# Patient Record
Sex: Female | Born: 1985 | Race: Black or African American | Hispanic: No | Marital: Single | Smoking: Never smoker
Health system: Southern US, Community
[De-identification: ages and names within clinical notes are randomized; demographics above are authoritative.]

## PROBLEM LIST (undated history)

## (undated) DIAGNOSIS — R638 Other symptoms and signs concerning food and fluid intake: Secondary | ICD-10-CM

## (undated) DIAGNOSIS — L83 Acanthosis nigricans: Secondary | ICD-10-CM

## (undated) DIAGNOSIS — N883 Incompetence of cervix uteri: Secondary | ICD-10-CM

## (undated) DIAGNOSIS — N946 Dysmenorrhea, unspecified: Secondary | ICD-10-CM

## (undated) DIAGNOSIS — R7303 Prediabetes: Secondary | ICD-10-CM

## (undated) DIAGNOSIS — G35 Multiple sclerosis: Secondary | ICD-10-CM

## (undated) DIAGNOSIS — J309 Allergic rhinitis, unspecified: Secondary | ICD-10-CM

## (undated) DIAGNOSIS — N941 Unspecified dyspareunia: Secondary | ICD-10-CM

## (undated) DIAGNOSIS — G43909 Migraine, unspecified, not intractable, without status migrainosus: Secondary | ICD-10-CM

## (undated) DIAGNOSIS — N879 Dysplasia of cervix uteri, unspecified: Secondary | ICD-10-CM

## (undated) DIAGNOSIS — G35D Multiple sclerosis, unspecified: Secondary | ICD-10-CM

## (undated) DIAGNOSIS — N939 Abnormal uterine and vaginal bleeding, unspecified: Secondary | ICD-10-CM

## (undated) HISTORY — DX: Unspecified dyspareunia: N94.10

## (undated) HISTORY — PX: CHOLECYSTECTOMY: SHX55

## (undated) HISTORY — PX: LAPAROSCOPY: SHX197

## (undated) HISTORY — PX: CERVICAL BIOPSY  W/ LOOP ELECTRODE EXCISION: SUR135

## (undated) HISTORY — DX: Allergic rhinitis, unspecified: J30.9

## (undated) HISTORY — DX: Dysplasia of cervix uteri, unspecified: N87.9

## (undated) HISTORY — DX: Prediabetes: R73.03

## (undated) HISTORY — PX: DILATION AND CURETTAGE OF UTERUS: SHX78

## (undated) HISTORY — DX: Other symptoms and signs concerning food and fluid intake: R63.8

## (undated) HISTORY — DX: Dysmenorrhea, unspecified: N94.6

## (undated) HISTORY — DX: Abnormal uterine and vaginal bleeding, unspecified: N93.9

## (undated) HISTORY — DX: Incompetence of cervix uteri: N88.3

## (undated) HISTORY — DX: Acanthosis nigricans: L83

## (undated) HISTORY — DX: Multiple sclerosis: G35

## (undated) HISTORY — PX: HYSTEROSCOPY WITH D & C: SHX1775

## (undated) HISTORY — PX: CERVICAL CERCLAGE: SHX1329

## (undated) HISTORY — PX: ABDOMINAL SURGERY: SHX537

## (undated) HISTORY — DX: Multiple sclerosis, unspecified: G35.D

---

## 2004-06-21 ENCOUNTER — Emergency Department: Payer: Self-pay | Admitting: Emergency Medicine

## 2005-02-26 ENCOUNTER — Emergency Department: Payer: Self-pay | Admitting: Unknown Physician Specialty

## 2005-02-28 ENCOUNTER — Ambulatory Visit: Payer: Self-pay | Admitting: Unknown Physician Specialty

## 2005-03-08 ENCOUNTER — Ambulatory Visit: Payer: Self-pay | Admitting: Vascular Surgery

## 2006-05-14 ENCOUNTER — Emergency Department: Payer: Self-pay | Admitting: Emergency Medicine

## 2006-06-03 ENCOUNTER — Emergency Department: Payer: Self-pay

## 2006-09-11 ENCOUNTER — Emergency Department: Payer: Self-pay | Admitting: Unknown Physician Specialty

## 2006-11-14 ENCOUNTER — Other Ambulatory Visit: Payer: Self-pay

## 2006-11-14 ENCOUNTER — Emergency Department: Payer: Self-pay | Admitting: Emergency Medicine

## 2007-01-13 ENCOUNTER — Encounter: Payer: Self-pay | Admitting: Maternal & Fetal Medicine

## 2007-02-13 ENCOUNTER — Encounter: Payer: Self-pay | Admitting: Maternal & Fetal Medicine

## 2007-03-03 ENCOUNTER — Encounter: Payer: Self-pay | Admitting: Maternal & Fetal Medicine

## 2007-03-20 ENCOUNTER — Encounter: Payer: Self-pay | Admitting: Maternal & Fetal Medicine

## 2007-03-31 ENCOUNTER — Inpatient Hospital Stay: Payer: Self-pay | Admitting: Unknown Physician Specialty

## 2008-04-14 ENCOUNTER — Emergency Department: Payer: Self-pay | Admitting: Emergency Medicine

## 2008-04-14 ENCOUNTER — Other Ambulatory Visit: Payer: Self-pay

## 2008-10-01 ENCOUNTER — Emergency Department: Payer: Self-pay | Admitting: Emergency Medicine

## 2008-10-22 ENCOUNTER — Emergency Department: Payer: Self-pay | Admitting: Internal Medicine

## 2008-12-02 ENCOUNTER — Emergency Department: Payer: Self-pay | Admitting: Emergency Medicine

## 2009-01-03 ENCOUNTER — Ambulatory Visit: Payer: Self-pay | Admitting: Obstetrics and Gynecology

## 2009-01-06 ENCOUNTER — Ambulatory Visit: Payer: Self-pay | Admitting: Obstetrics and Gynecology

## 2009-02-19 ENCOUNTER — Observation Stay: Payer: Self-pay | Admitting: Unknown Physician Specialty

## 2009-06-02 ENCOUNTER — Emergency Department: Payer: Self-pay | Admitting: Unknown Physician Specialty

## 2009-06-23 ENCOUNTER — Emergency Department: Payer: Self-pay | Admitting: Emergency Medicine

## 2010-10-30 ENCOUNTER — Inpatient Hospital Stay: Payer: Self-pay | Admitting: Obstetrics and Gynecology

## 2012-03-02 ENCOUNTER — Ambulatory Visit: Payer: Self-pay

## 2012-06-19 ENCOUNTER — Ambulatory Visit: Payer: Self-pay | Admitting: Internal Medicine

## 2012-07-31 ENCOUNTER — Ambulatory Visit: Payer: Self-pay | Admitting: Obstetrics and Gynecology

## 2012-07-31 LAB — PREGNANCY, URINE: Pregnancy Test, Urine: NEGATIVE m[IU]/mL

## 2012-07-31 LAB — CBC
HCT: 36.2 % (ref 35.0–47.0)
MCV: 82 fL (ref 80–100)
Platelet: 295 10*3/uL (ref 150–440)
RBC: 4.44 10*6/uL (ref 3.80–5.20)
WBC: 8.3 10*3/uL (ref 3.6–11.0)

## 2012-08-04 ENCOUNTER — Ambulatory Visit: Payer: Self-pay | Admitting: Obstetrics and Gynecology

## 2012-08-06 LAB — PATHOLOGY REPORT

## 2012-12-21 ENCOUNTER — Emergency Department: Payer: Self-pay | Admitting: Emergency Medicine

## 2012-12-21 LAB — CBC
HCT: 39.4 % (ref 35.0–47.0)
HGB: 13 g/dL (ref 12.0–16.0)
MCH: 26.7 pg (ref 26.0–34.0)
MCV: 81 fL (ref 80–100)
RBC: 4.87 10*6/uL (ref 3.80–5.20)
RDW: 13.6 % (ref 11.5–14.5)

## 2012-12-21 LAB — COMPREHENSIVE METABOLIC PANEL
Anion Gap: 9 (ref 7–16)
Bilirubin,Total: 0.2 mg/dL (ref 0.2–1.0)
Chloride: 105 mmol/L (ref 98–107)
Co2: 23 mmol/L (ref 21–32)
Creatinine: 0.94 mg/dL (ref 0.60–1.30)
EGFR (African American): >60
EGFR (Non-African Amer.): >60
Glucose: 108 mg/dL — ABNORMAL HIGH (ref 65–99)
Osmolality: 274 (ref 275–301)
Potassium: 3.5 mmol/L (ref 3.5–5.1)
Total Protein: 8.2 g/dL (ref 6.4–8.2)

## 2012-12-21 LAB — URINALYSIS, COMPLETE
Bacteria: NONE SEEN
Bilirubin,UR: NEGATIVE
Glucose,UR: NEGATIVE mg/dL (ref 0–75)
Ketone: NEGATIVE
Leukocyte Esterase: NEGATIVE
Ph: 5 (ref 4.5–8.0)
Protein: NEGATIVE
Specific Gravity: 1.027 (ref 1.003–1.030)
Squamous Epithelial: 5
WBC UR: 1 /HPF (ref 0–5)

## 2012-12-21 LAB — LIPASE, BLOOD: Lipase: 164 U/L (ref 73–393)

## 2013-10-15 ENCOUNTER — Ambulatory Visit: Payer: Self-pay | Admitting: Internal Medicine

## 2013-10-15 LAB — HCG, QUANTITATIVE, PREGNANCY

## 2014-12-14 NOTE — Op Note (Signed)
PATIENT NAME:  Meagan Powers, Meagan Powers MR#:  [id number] DATE OF BIRTH:  1986-05-09  DATE OF PROCEDURE:  08/04/2012  PREOPERATIVE DIAGNOSES:  1. Abnormal uterine bleeding.  2. Chronic pelvic pain.   POSTOPERATIVE DIAGNOSES:  1. Abnormal uterine bleeding.  2. Chronic pelvic pain.  3. Mucinous endometrial polyp.  4. Normal pelvis.   OPERATIVE PROCEDURES:  1. Hysteroscopy.  2. Dilation and curettage of the endometrium.  3. Diagnostic laparoscopy.   SURGEON: Meagan Powers. Meagan Powers, M.D.   FIRST ASSISTANT: None.   ANESTHESIA: General endotracheal.   INDICATIONS: The patient is a 29 year old African American female, multiparous, with Implanon in place, who presents for evaluation of chronic pelvic pain and chronic abnormal uterine bleeding. The patient had preoperative ultrasound that demonstrated a possible endometrial polyp in the lower uterine segment. Because of her persistent abnormal bleeding as well as chronic pain syndrome including severe dysmenorrhea, perimenstrual low backache, and deep thrust dyspareunia the patient desires to have laparoscopy work-up to evaluate for possible endometriosis.   FINDINGS AT SURGERY: The uterus is midplane and of normal size and shape. On bimanual examination, it was mobile and descended well with tenaculum traction. The pelvis was gynecoid. The uterus did sound to 11 cm. On hysteroscopy there was what appeared to be a mucinous type polyp in the lower uterine segment which was extracted during the dilation and curettage procedure. No other abnormalities were seen. On laparoscopy the pelvis was normal. There was no evidence of endometriosis or pelvic adhesive disease.   DESCRIPTION OF PROCEDURE: The patient was brought to the Operating Room where she was placed in the dorsal lithotomy position, following induction of general endotracheal anesthesia. A ChloraPrep and chlorhexidine abdominal, perineal, and intravaginal prep and drape was performed in the standard  fashion. A red Robinson catheter was used to drain 50 mL of urine from the bladder. A single-tooth tenaculum was placed on the anterior lip of the cervix. The uterus sounded to 11 cm. Hanks dilators were used up to a #20 [place] caliber to dilate the endocervical canal. The hysteroscope using lactated Ringer's as irrigant was used to assess the intrauterine environment. Photo documentation was completed. Both smooth and serrated curettes were used to curettage the endometrial cavity. Stone polyp forceps were used to help extract tissue. A mucinous polyp was removed during this process. Repeat hysteroscopy revealed no significant tissue left behind. Upon completion of the hysteroscopy and dilation and curettage, the laparoscopy was performed. The patient was placed in the low lithotomy position and a subumbilical vertical incision 5 mm in length was made. Direct entry was made with the Optiview laparoscope with no evidence of bowel or vascular injury. One other 5 mm port was placed, in the suprapubic region, in the midline. This was done under direct visualization. The above-noted findings of a normal pelvis was documented. The appendix was normal. Upper abdomen was normal as well. On completion of the survey, no biopsies were taken, and the procedure was terminated with all instrumentation being removed from the abdomen. The pneumoperitoneum was released. The incisions were closed with Dermabond glue. The patient was then awakened, extubated, and taken to the Recovery Room in satisfactory condition. Estimated blood loss was minimal. There were no complications. All instrument, needle and sponge counts were verified as correct. ____________________________ Meagan Powers. Meagan Herzberg, MD mad:slb D: 08/04/2012 16:43:22 ET T: 08/04/2012 17:29:51 ET JOB#: [id number]  cc: Meagan Done A. Sheppard Luckenbach, MD, <Dictator> Meagan Powers Meagan Minar MD ELECTRONICALLY SIGNED 08/06/2012 11:59

## 2015-09-06 ENCOUNTER — Ambulatory Visit
Admission: EM | Admit: 2015-09-06 | Discharge: 2015-09-06 | Disposition: A | Discharge status: Home / Self Care | Payer: Medicaid Other | Attending: Family Medicine | Admitting: Family Medicine

## 2015-09-06 ENCOUNTER — Encounter: Payer: Self-pay | Admitting: Emergency Medicine

## 2015-09-06 DIAGNOSIS — J040 Acute laryngitis: Secondary | ICD-10-CM | POA: Diagnosis not present

## 2015-09-06 DIAGNOSIS — R05 Cough: Secondary | ICD-10-CM | POA: Diagnosis present

## 2015-09-06 DIAGNOSIS — R0981 Nasal congestion: Secondary | ICD-10-CM | POA: Diagnosis present

## 2015-09-06 DIAGNOSIS — J029 Acute pharyngitis, unspecified: Secondary | ICD-10-CM | POA: Diagnosis not present

## 2015-09-06 HISTORY — DX: Migraine, unspecified, not intractable, without status migrainosus: G43.909

## 2015-09-06 LAB — RAPID STREP SCREEN (MED CTR MEBANE ONLY): Streptococcus, Group A Screen (Direct): NEGATIVE

## 2015-09-06 MED ORDER — FEXOFENADINE-PSEUDOEPHED ER 180-240 MG PO TB24: 1.0000 | ORAL_TABLET | Freq: Every day | ORAL | Status: DC

## 2015-09-06 MED ORDER — AZITHROMYCIN 250 MG PO TABS: ORAL_TABLET | ORAL | Status: DC

## 2015-09-06 NOTE — ED Provider Notes (Signed)
CSN: [id number]     Arrival date & time 09/06/15  1306 History   First MD Initiated Contact with Patient 09/06/15 1418    Nurses notes were reviewed.  Chief Complaint  Patient presents with  . Cough  . Nasal Congestion  .Marland Kitchen  Patient reports Friday not feeling well Friday evening she states she had a severe sore throat hoarseness and the hoarseness, epistaxis. She is coughing more and having chest congestion as well. Laryngitis is also continuing to get bad. She denies any fever and denies any ear pain at this time. She does of course have sore throat is getting worse and she's lost her taste (Consider location/radiation/quality/duration/timing/severity/associated sxs/prior Treatment) Patient is a 30 y.o. female presenting with cough. The history is provided by the patient. No language interpreter was used.  Cough Cough characteristics:  Non-productive Severity:  Moderate Onset quality:  Sudden Timing:  Sporadic Progression:  Waxing and waning Chronicity:  New Smoker: no   Context: upper respiratory infection   Context: not animal exposure, not exposure to allergens, not fumes, not occupational exposure, not sick contacts, not smoke exposure, not weather changes and not with activity   Relieved by:  Cough suppressants Worsened by:  Nothing tried Ineffective treatments:  Fluids Associated symptoms: fever, rhinorrhea, sinus congestion and sore throat   Associated symptoms: no chest pain, no ear fullness, no ear pain, no headaches, no rash, no shortness of breath and no weight loss   Risk factors: no chemical exposure, no recent infection and no recent travel     Past Medical History  Diagnosis Date  . Migraine    Past Surgical History  Procedure Laterality Date  . Breast surgery     History reviewed. No pertinent family history. Social History  Substance Use Topics  . Smoking status: Never Smoker   . Smokeless tobacco: None  . Alcohol Use: Yes   OB History    No data  available     Review of Systems  Constitutional: Positive for fever. Negative for weight loss.  HENT: Positive for rhinorrhea and sore throat. Negative for ear pain.   Respiratory: Positive for cough. Negative for shortness of breath.   Cardiovascular: Negative for chest pain.  Skin: Negative for rash.  Neurological: Negative for headaches.  All other systems reviewed and are negative.   Allergies  Shellfish allergy  Home Medications   Prior to Admission medications   Medication Sig Start Date End Date Taking? Authorizing Provider  etonogestrel (IMPLANON) 68 MG IMPL implant 1 each by Subdermal route once.   Yes Historical Provider, MD  fluticasone (FLONASE) 50 MCG/ACT nasal spray Place 2 sprays into both nostrils daily.   Yes Historical Provider, MD  SUMAtriptan (IMITREX) 50 MG tablet Take 50 mg by mouth every 2 (two) hours as needed for migraine. May repeat in 2 hours if headache persists or recurs.   Yes Historical Provider, MD   Meds Ordered and Administered this Visit  Medications - No data to display  BP 120/74 mmHg  Pulse 95  Temp(Src) 97.3 F (36.3 C) (Tympanic)  Resp 16  Ht 5\' 3"  (1.6 m)  SpO2 100% No data found.   Physical Exam  Constitutional: She appears well-developed and well-nourished.  HENT:  Head: Normocephalic and atraumatic.  Right Ear: Hearing, tympanic membrane, external ear and ear canal normal.  Left Ear: Hearing, tympanic membrane, external ear and ear canal normal.  Nose: Mucosal edema and rhinorrhea present.  Mouth/Throat: Mucous membranes are normal. No oral lesions. No  uvula swelling or dental caries. Posterior oropharyngeal erythema present.  Eyes: Conjunctivae are normal. Pupils are equal, round, and reactive to light.  Neck: Neck supple. No tracheal deviation present.  Cardiovascular: Regular rhythm.   Pulmonary/Chest: Effort normal.  Lymphadenopathy:    She has cervical adenopathy.  Vitals reviewed.   ED Course  Procedures  (including critical care time)  Labs Review Labs Reviewed  RAPID STREP SCREEN (NOT AT [facility])  CULTURE, GROUP A STREP [facility])    Imaging Review No results found.   Visual Acuity Review  Right Eye Distance:   Left Eye Distance:   Bilateral Distance:    Right Eye Near:   Left Eye Near:    Bilateral Near:     Results for orders placed or performed during the hospital encounter of 09/06/15  Rapid strep screen  Result Value Ref Range   Streptococcus, Group A Screen (Direct) NEGATIVE NEGATIVE     MDM   1. Laryngitis, acute   2. Pharyngitis    Because of patient's hoarseness and irritation with throat strep test was negative brown place on Z-Pak and some Allegra-D one tablet twice a day. Follow-up PCP in 2 weeks not better. Work note for today will be given as well.   Frederich Cha, MD 09/06/15 [phone]

## 2015-09-06 NOTE — ED Notes (Signed)
Patient c/o cough, chest congestion, and nasal congestion since Friday.  Patient denies fevers.

## 2015-09-06 NOTE — Discharge Instructions (Signed)
Laryngitis Laryngitis is swelling (inflammation) of your vocal cords. This causes hoarseness, coughing, loss of voice, sore throat, or a dry throat. When your vocal cords are inflamed, your voice sounds different. Laryngitis can be temporary (acute) or long-term (chronic). Most cases of acute laryngitis improve with time. Chronic laryngitis is laryngitis that lasts for more than three weeks. HOME CARE  Drink enough fluid to keep your pee (urine) clear or pale yellow.  Breathe in moist air. Use a humidifier if you live in a dry climate.  Take medicines only as told by your doctor.  Do not smoke cigarettes or electronic cigarettes. If you need help quitting, ask your doctor.  Talk as little as possible. Also avoid whispering, which can cause vocal strain.  Write instead of talking. Do this until your voice is back to normal. GET HELP IF:  You have a fever.  Your pain is worse.  You have trouble swallowing. GET HELP RIGHT AWAY IF:  You cough up blood.  You have trouble breathing.   This information is not intended to replace advice given to you by your health care provider. Make sure you discuss any questions you have with your health care provider.   Document Released: 08/02/2011 Document Revised: 09/03/2014 Document Reviewed: 01/26/2014 Elsevier Interactive Patient Education 2016 Elsevier Inc.  Pharyngitis Pharyngitis is a sore throat (pharynx). There is redness, pain, and swelling of your throat. HOME CARE   Drink enough fluids to keep your pee (urine) clear or pale yellow.  Only take medicine as told by your doctor.  You may get sick again if you do not take medicine as told. Finish your medicines, even if you start to feel better.  Do not take aspirin.  Rest.  Rinse your mouth (gargle) with salt water ( tsp of salt per 1 qt of water) every 1-2 hours. This will help the pain.  If you are not at risk for choking, you can suck on hard candy or sore throat  lozenges. GET HELP IF:  You have large, tender lumps on your neck.  You have a rash.  You cough up green, yellow-brown, or bloody spit. GET HELP RIGHT AWAY IF:   You have a stiff neck.  You drool or cannot swallow liquids.  You throw up (vomit) or are not able to keep medicine or liquids down.  You have very bad pain that does not go away with medicine.  You have problems breathing (not from a stuffy nose). MAKE SURE YOU:   Understand these instructions.  Will watch your condition.  Will get help right away if you are not doing well or get worse.   This information is not intended to replace advice given to you by your health care provider. Make sure you discuss any questions you have with your health care provider.   Document Released: 01/30/2008 Document Revised: 06/03/2013 Document Reviewed: 04/20/2013 Elsevier Interactive Patient Education 2016 Elsevier Inc.  Sore Throat A sore throat is a painful, burning, sore, or scratchy feeling of the throat. There may be pain or tenderness when swallowing or talking. You may have other symptoms with a sore throat. These include coughing, sneezing, fever, or a swollen neck. A sore throat is often the first sign of another sickness. These sicknesses may include a cold, flu, strep throat, or an infection called mono. Most sore throats go away without medical treatment.  HOME CARE   Only take medicine as told by your doctor.  Drink enough fluids to keep your  pee (urine) clear or pale yellow.  Rest as needed.  Try using throat sprays, lozenges, or suck on hard candy (if older than 4 years or as told).  Sip warm liquids, such as broth, herbal tea, or warm water with honey. Try sucking on frozen ice pops or drinking cold liquids.  Rinse the mouth (gargle) with salt water. Mix 1 teaspoon salt with 8 ounces of water.  Do not smoke. Avoid being around others when they are smoking.  Put a humidifier in your bedroom at night to  moisten the air. You can also turn on a hot shower and sit in the bathroom for 5-10 minutes. Be sure the bathroom door is closed. GET HELP RIGHT AWAY IF:   You have trouble breathing.  You cannot swallow fluids, soft foods, or your spit (saliva).  You have more puffiness (swelling) in the throat.  Your sore throat does not get better in 7 days.  You feel sick to your stomach (nauseous) and throw up (vomit).  You have a fever or lasting symptoms for more than 2-3 days.  You have a fever and your symptoms suddenly get worse. MAKE SURE YOU:   Understand these instructions.  Will watch your condition.  Will get help right away if you are not doing well or get worse.   This information is not intended to replace advice given to you by your health care provider. Make sure you discuss any questions you have with your health care provider.   Document Released: 05/22/2008 Document Revised: 05/07/2012 Document Reviewed: 04/20/2012 Elsevier Interactive Patient Education [employer].

## 2015-09-09 LAB — CULTURE, GROUP A STREP (THRC)

## 2015-09-10 ENCOUNTER — Telehealth: Payer: Self-pay

## 2015-09-10 NOTE — ED Notes (Signed)
Patient contacted that Urine culture result negative. States feel "a little better"

## 2015-10-18 ENCOUNTER — Ambulatory Visit: Payer: Self-pay | Admitting: Obstetrics and Gynecology

## 2015-10-25 ENCOUNTER — Ambulatory Visit (INDEPENDENT_AMBULATORY_CARE_PROVIDER_SITE_OTHER): Payer: Medicaid Other | Admitting: Obstetrics and Gynecology

## 2015-10-25 ENCOUNTER — Encounter: Payer: Self-pay | Admitting: Obstetrics and Gynecology

## 2015-10-25 VITALS — BP 109/68 | HR 89 | Ht 64.0 in | Wt 228.8 lb

## 2015-10-25 DIAGNOSIS — N840 Polyp of corpus uteri: Secondary | ICD-10-CM

## 2015-10-25 DIAGNOSIS — N871 Moderate cervical dysplasia: Secondary | ICD-10-CM | POA: Diagnosis not present

## 2015-10-25 DIAGNOSIS — R7303 Prediabetes: Secondary | ICD-10-CM | POA: Diagnosis not present

## 2015-10-25 DIAGNOSIS — N946 Dysmenorrhea, unspecified: Secondary | ICD-10-CM | POA: Diagnosis not present

## 2015-10-25 DIAGNOSIS — N939 Abnormal uterine and vaginal bleeding, unspecified: Secondary | ICD-10-CM | POA: Diagnosis not present

## 2015-10-25 DIAGNOSIS — N941 Unspecified dyspareunia: Secondary | ICD-10-CM | POA: Insufficient documentation

## 2015-10-25 DIAGNOSIS — L83 Acanthosis nigricans: Secondary | ICD-10-CM

## 2015-10-25 DIAGNOSIS — R638 Other symptoms and signs concerning food and fluid intake: Secondary | ICD-10-CM | POA: Diagnosis not present

## 2015-10-25 DIAGNOSIS — J309 Allergic rhinitis, unspecified: Secondary | ICD-10-CM | POA: Insufficient documentation

## 2015-10-25 NOTE — Progress Notes (Signed)
Chief complaint: 1. Abnormal uterine bleeding  Patient is a 30 year old African-American female, para 1212, using Nexplanon  for contraception 1 year, presents for evaluation of abnormal uterine bleeding. Cycles are irregular and crampy with bleeding lasting upwards of 3 weeks with spotting and clotting. Last cycle started on 09/13/2015, persisted for 3 weeks, ending on 10/01/2015; heavy clots and cramps were noted. Ibuprofen is used for cramping Patient desires possibly 1 more pregnancy if possible.  Past history of abnormal uterine bleeding; status post hysteroscopy/D&C in 2013 along with laparoscopy-endometrial polyps on pathology and no evidence of endometriosis Patient is desiring conception. Patient was evaluated at the [facility] and CBC did not demonstrate any anemia findings  Past Medical History  Diagnosis Date  . Migraine   . Acanthosis nigricans   . AR (allergic rhinitis)   . Migraine   . Cervical dysplasia   . Pre-diabetes   . Increased BMI   . Incompetent cervix   . Dyspareunia in female   . Dysmenorrhea   . Abnormal uterine bleeding (AUB)    Past Surgical History  Procedure Laterality Date  . Hysteroscopy w/d&c    . Laparoscopy    . Cervical biopsy  w/ loop electrode excision    . Cervical cerclage      x 2  . Cholecystectomy      Review of systems: Per history of present illness  OBJECTIVE: BP 109/68 mmHg  Pulse 89  Ht 5\' 4"  (1.626 m)  Wt 228 lb 12.8 oz (103.783 kg)  BMI 39.25 kg/m2  LMP 09/12/2015 Pleasant African-American female in no acute distress. She is alert and oriented. Abdomen: Soft, nontender, without organomegaly Pelvic exam: External genitalia-normal BUS-normal Vagina-no active bleeding Cervix-normal; mucus at os; no cervical motion tenderness  Uterus-midplane, decreased mobility, mild tenderness 1/4 Adnexa-nonpalpable and nontender  ASSESSMENT: 1. Abnormal uterine bleeding status post Nexplanon insertion 1 year ago 2. Past  history of endometrial polyps on D&C specimen from 2013 3. Chronic pelvic pain, status post laparoscopy 2013 without evidence of endometriosis 4. Desires conception one more time   PLAN: 1. Pelvic ultrasound to assess for endometrial abnormalities 2. If endometrium appears prominent, consider hysteroscopy/D&C 3. Return in 2 weeks for follow-up and further management planning  A total of 25 minutes were spent face-to-face with the patient during this encounter and over half of that time involved counseling and coordination of care.  Brayton Mars, MD  Note: This dictation was prepared with Dragon dictation along with smaller phrase technology. Any transcriptional errors that result from this process are unintentional.

## 2015-10-25 NOTE — Patient Instructions (Signed)
1. Ultrasound is scheduled to assess abnormal uterine bleeding 2. Return in 1 week after ultrasound for further management planning

## 2015-11-02 ENCOUNTER — Other Ambulatory Visit: Payer: Medicaid Other

## 2015-11-03 ENCOUNTER — Ambulatory Visit (INDEPENDENT_AMBULATORY_CARE_PROVIDER_SITE_OTHER): Payer: Medicaid Other

## 2015-11-03 DIAGNOSIS — N939 Abnormal uterine and vaginal bleeding, unspecified: Secondary | ICD-10-CM

## 2015-11-09 ENCOUNTER — Encounter: Payer: Self-pay | Admitting: Obstetrics and Gynecology

## 2015-11-09 ENCOUNTER — Ambulatory Visit (INDEPENDENT_AMBULATORY_CARE_PROVIDER_SITE_OTHER): Payer: Medicaid Other | Admitting: Obstetrics and Gynecology

## 2015-11-09 VITALS — BP 101/64 | HR 86 | Ht 64.0 in | Wt 226.6 lb

## 2015-11-09 DIAGNOSIS — N941 Unspecified dyspareunia: Secondary | ICD-10-CM

## 2015-11-09 DIAGNOSIS — N939 Abnormal uterine and vaginal bleeding, unspecified: Secondary | ICD-10-CM | POA: Diagnosis not present

## 2015-11-09 DIAGNOSIS — N946 Dysmenorrhea, unspecified: Secondary | ICD-10-CM

## 2015-11-09 NOTE — Progress Notes (Signed)
GYN ENCOUNTER NOTE  Subjective:       Meagan Powers is a 30 y.o. [phone] female is here for gynecologic evaluation of the following issues:  1. Irregular Uterine Bleeding 2. Dysmenorrhea 3. Dyspareunia 4. [place] follow up  30 y/o F with an Implanon comes in for f/u on [place] results. Pt had continuous menses x 3 weeks. Associated with sharp pelvic and R sided pain treated with ibuprofen 800 mg BID.  Pt has a hx of abnormal uterine bleeding with D&C and visualization of polyps. No endometriosis noted.  Recent ultrasound demonstrates normal-appearing uterus with small fluid collection in Cervix, and normal endometrial stripe; simple rt ovarian cyst also identified.   Gynecologic History LMP: 10/31/15 (approximate)  Contraception: Nexplanon    Obstetric History OB History  Gravida Para Term Preterm AB SAB TAB Ectopic Multiple Living  4 4 1 3      2     # Outcome Date GA Lbr Len/2nd Weight Sex Delivery Anes PTL Lv  4 Preterm 2010   1 lb 4.8 oz (0.59 kg) F Vag-Spont   Y  3 Preterm 2008    F Vag-Spont   FD  2 Preterm 2008    M Vag-Spont   FD  1 Term 2004   7 lb 1.6 oz (3.221 kg) F Vag-Spont   Y      Past Medical History  Diagnosis Date  . Migraine   . Acanthosis nigricans   . AR (allergic rhinitis)   . Migraine   . Cervical dysplasia   . Pre-diabetes   . Increased BMI   . Incompetent cervix   . Dyspareunia in female   . Dysmenorrhea   . Abnormal uterine bleeding (AUB)     Past Surgical History  Procedure Laterality Date  . Hysteroscopy w/d&c    . Laparoscopy    . Cervical biopsy  w/ loop electrode excision    . Cervical cerclage      x 2  . Cholecystectomy      Current Outpatient Prescriptions on File Prior to Visit  Medication Sig Dispense Refill  . etonogestrel (IMPLANON) 68 MG IMPL implant 1 each by Subdermal route once.    . fluticasone (FLONASE) 50 MCG/ACT nasal spray Place 2 sprays into both nostrils daily.    Marland Kitchen ibuprofen (ADVIL,MOTRIN) 800 MG tablet Take 800 mg by  mouth every 8 (eight) hours as needed.    . SUMAtriptan (IMITREX) 50 MG tablet Take 50 mg by mouth every 2 (two) hours as needed for migraine. May repeat in 2 hours if headache persists or recurs.     No current facility-administered medications on file prior to visit.    Allergies  Allergen Reactions  . Shellfish Allergy Anaphylaxis    Social History   Social History  . Marital Status: Married    Spouse Name: N/A  . Number of Children: N/A  . Years of Education: N/A   Occupational History  . Not on file.   Social History Main Topics  . Smoking status: Never Smoker   . Smokeless tobacco: Not on file  . Alcohol Use: Yes     Comment: occas  . Drug Use: Yes    Special: Solvent inhalants  . Sexual Activity: Yes    Birth Control/ Protection: Implant   Other Topics Concern  . Not on file   Social History Narrative    Family History  Problem Relation Age of Onset  . Hypertension Mother   . Cancer Neg Hx   .  Diabetes Neg Hx   . Heart disease Neg Hx     The following portions of the patient's history were reviewed and updated as appropriate: allergies, current medications, past family history, past medical history, past social history, past surgical history and problem list.  Review of Systems Review of Systems - General ROS: negative for - chills, fatigue, fever, hot flashes, malaise or night sweats Hematological and Lymphatic ROS: negative for - bleeding problems or swollen lymph nodes Gastrointestinal ROS: negative for - abdominal pain, blood in stools, change in bowel habits and nausea/vomiting Musculoskeletal ROS: negative for - joint pain, muscle pain or muscular weakness Genito-Urinary ROS: positive for irregular menses, dysmenorrhea, and dyspareunia negative for - change in menstrual cycle, dysuria, genital discharge, genital ulcers, hematuria, incontinence, nocturia or pelvic painjj  Objective:   BP 101/64 mmHg  Pulse 86  Ht 5\' 4"  (1.626 m)  Wt 226 lb 9.6  oz (102.785 kg)  BMI 38.88 kg/m2  LMP 09/12/2015 CONSTITUTIONAL: Well-developed, well-nourished female in no acute distress.  PE: Deffered   Assessment:   1. Abnormal uterine bleeding (AUB) On Nexplanon; no current bleeding  2. Dyspareunia, female  3. Dysmenorrhea Cannot rule out endometriosis, although laparoscopy did not demonstrate any classic pathology findings     Plan:  1.  Menstrual calendar, monitoring 2.  Return in 3 months for follow-up on pain and abnormal uterine bleeding 3.  Consider OCP use x 1 month to regulate menses   A total of 15 minutes were spent face-to-face with the patient during this encounter and over half of that time dealt with counseling and coordination of care.    Luz Lex PA-S Brayton Mars, MD   I have seen, interviewed, and examined the patient in conjunction with the [facility].A. student and affirm the diagnosis and management plan. Martin A. DeFrancesco, MD, FACOG  Note: This dictation was prepared with Dragon dictation along with smaller phrase technology. Any transcriptional errors that result from this process are unintentional.

## 2015-11-09 NOTE — Patient Instructions (Addendum)
1. Maintain menstrual calendar monitoring for evaluation abnormal uterine bleeding 2. Return in 3 months for follow-up on pelvic pain and abnormal uterine bleeding

## 2016-05-09 ENCOUNTER — Ambulatory Visit (INDEPENDENT_AMBULATORY_CARE_PROVIDER_SITE_OTHER): Payer: Medicaid Other | Admitting: Obstetrics and Gynecology

## 2016-05-09 ENCOUNTER — Encounter: Payer: Self-pay | Admitting: Obstetrics and Gynecology

## 2016-05-09 VITALS — BP 101/68 | HR 84 | Ht 64.0 in | Wt 221.3 lb

## 2016-05-09 DIAGNOSIS — Z3049 Encounter for surveillance of other contraceptives: Secondary | ICD-10-CM | POA: Diagnosis not present

## 2016-05-09 DIAGNOSIS — N938 Other specified abnormal uterine and vaginal bleeding: Secondary | ICD-10-CM | POA: Diagnosis not present

## 2016-05-09 DIAGNOSIS — N939 Abnormal uterine and vaginal bleeding, unspecified: Secondary | ICD-10-CM

## 2016-05-09 NOTE — Patient Instructions (Signed)
1. Continue menstrual calendar monitoring for abnormal uterine bleeding on Nexplanon 2. Return in 6 months for follow-up

## 2016-05-09 NOTE — Progress Notes (Signed)
Chief complaint: 1. Abnormal uterine bleeding 2. History of dysmenorrhea 3. History of dyspareunia 4. Contraception-Nexplanon  Patient presents for 6 month follow-up. She states that over the past 6 months she has had 2 menstrual cycles; each menstrual cycle has ranged from 10-14 days of spotting/bleeding. Pelvic cramping is associated with the bleeding but not severe  Patient has past history of IUD use with dissatisfaction She has used Depo-Provera in the past but not desiring to use going forward. Patient is contemplating possible birth control pill or NuvaRing option.  Past medical history, past surgical history, problem list, medications, and allergies are reviewed  OBJECTIVE: BP 101/68   Pulse 84   Ht 5\' 4"  (1.626 m)   Wt 221 lb 4.8 oz (100.4 kg)   LMP 04/02/2016 (Approximate)   BMI 37.99 kg/m  Pleasant well-appearing female in no acute distress ABDOMEN: Soft, nontender Pelvic: External genitalia-normal BUS-normal Vagina-normal Cervix-normal Uterus-midplane, mobile, nontender, normal size and shape Adnexa-nonpalpable nontender Rectovaginal-normal external exam  ASSESSMENT: 1. Abnormal uterine bleeding on Nexplanon, currently tolerable 2. History of dysmenorrhea 3. History of dyspareunia 4. Patient is not ready to change contraceptive choice  PLAN: 1. Monitor symptomatology 2. Return in 6 months for follow-up and further management planning  A total of 15 minutes were spent face-to-face with the patient during this encounter and over half of that time dealt with counseling and coordination of care.  Brayton Mars, MD  Note: This dictation was prepared with Dragon dictation along with smaller phrase technology. Any transcriptional errors that result from this process are unintentional.

## 2016-11-06 ENCOUNTER — Ambulatory Visit: Payer: Medicaid Other | Admitting: Obstetrics and Gynecology

## 2016-11-20 ENCOUNTER — Encounter: Payer: Self-pay | Admitting: Obstetrics and Gynecology

## 2016-11-20 ENCOUNTER — Ambulatory Visit (INDEPENDENT_AMBULATORY_CARE_PROVIDER_SITE_OTHER): Payer: Self-pay | Admitting: Obstetrics and Gynecology

## 2016-11-20 VITALS — BP 108/68 | HR 90 | Ht 64.0 in | Wt 225.1 lb

## 2016-11-20 DIAGNOSIS — N939 Abnormal uterine and vaginal bleeding, unspecified: Secondary | ICD-10-CM

## 2016-11-20 DIAGNOSIS — Z3049 Encounter for surveillance of other contraceptives: Secondary | ICD-10-CM

## 2016-11-20 DIAGNOSIS — N946 Dysmenorrhea, unspecified: Secondary | ICD-10-CM

## 2016-11-20 MED ORDER — NORGESTIMATE-ETH ESTRADIOL 0.25-35 MG-MCG PO TABS: 1.0000 | ORAL_TABLET | Freq: Every day | ORAL | 11 refills | Status: DC

## 2016-11-20 NOTE — Progress Notes (Signed)
Chief complaint: 1. Abnormal uterine bleeding 2. Contraceptive management  Patient presents for follow-up on abnormal uterine bleeding. Menstrual cycles continue to be irregular on the Nexplanon. November 2017 menses-2 weeks. December 2017 menses-2 weeks Menses January 2018-3 weeks Menses February 2018-none Menses March 2018-none  Patient is willing to go on hormonal contraception to help regulate bleeding. She has never used birth control pills or the NuvaRing. She is willing to want a trial of OCPs. Previous history is notable for IUD migration-unsatisfactory, and Depo-Provera-unsatisfactory.  Past medical history, past surgical history, problem list, medications, and allergies are reviewed  OBJECTIVE: BP 108/68   Pulse 90   Ht 5\' 4"  (1.626 m)   Wt 225 lb 1.6 oz (102.1 kg)   LMP 09/03/2016 (Exact Date)   BMI 38.64 kg/m  Physical exam-deferred  ASSESSMENT: 1. Abnormal uterine bleeding on Nexplanon 2. Desires cycle regulation with hormonal contraception 3. Past history notable for unsatisfactory use of Mirena IUD and Depo-Provera  PLAN: 1. Leave Nexplanon in place until date of removal-03/06/2018 2. Begin Sprintec oral contraceptive with Cycle 3. Maintain menstrual calendar monitoring 4. Return in 6 months for follow-up on abnormal uterine bleeding 5. Maintain routine gynecology wellness exams at [facility].  A total of 15 minutes were spent face-to-face with the patient during this encounter and over half of that time dealt with counseling and coordination of care.   Brayton Mars, MD  Note: This dictation was prepared with Dragon dictation along with smaller phrase technology. Any transcriptional errors that result from this process are unintentional.

## 2016-11-20 NOTE — Patient Instructions (Signed)
1. Begin Sprintec oral contraceptive with Cycle 2. Maintain menstrual calendar monitoring 3. Return in 6 months for follow-up on abnormal uterine bleeding 4. Return in July 2019 for Nexplanon removal. I recommend that you leave the Nexplanon in place while on birth control pills in order to help regulate bleeding

## 2017-05-22 ENCOUNTER — Encounter: Payer: Self-pay | Admitting: Obstetrics and Gynecology

## 2017-05-22 ENCOUNTER — Ambulatory Visit (INDEPENDENT_AMBULATORY_CARE_PROVIDER_SITE_OTHER): Payer: Self-pay | Admitting: Obstetrics and Gynecology

## 2017-05-22 VITALS — BP 123/83 | HR 98 | Ht 64.0 in | Wt 234.3 lb

## 2017-05-22 DIAGNOSIS — Z3049 Encounter for surveillance of other contraceptives: Secondary | ICD-10-CM

## 2017-05-22 DIAGNOSIS — N939 Abnormal uterine and vaginal bleeding, unspecified: Secondary | ICD-10-CM

## 2017-05-22 NOTE — Progress Notes (Signed)
Chief complaint: 1. Abnormal uterine bleeding 2. Nexplanon monitoring  Meagan Powers presents today for 6 month follow-up on abnormal uterine bleeding. She was having irregularity of bleeding since her Nexplanon insertion and at last visit was given birth control pill prescription to help override the abnormal uterine bleeding. She briefly took the birth control pills and subsequently discontinued them because of headache side effects. The patient does have history of migraines. Over the past 6 months the patient has noted abnormal bleeding in April 2018 and May 2018, with each episode lasting 2-3 weeks. She has not had any menstrual bleeding since May 2018.  Past Medical History:  Diagnosis Date  . Abnormal uterine bleeding (AUB)   . Acanthosis nigricans   . AR (allergic rhinitis)   . Cervical dysplasia   . Dysmenorrhea   . Dyspareunia in female   . Incompetent cervix   . Increased BMI   . Migraine   . Migraine   . Pre-diabetes    Past Surgical History:  Procedure Laterality Date  . CERVICAL BIOPSY  W/ LOOP ELECTRODE EXCISION    . CERVICAL CERCLAGE     x 2  . CHOLECYSTECTOMY    . HYSTEROSCOPY W/D&C    . LAPAROSCOPY      OBJECTIVE: BP 123/83   Pulse 98   Ht 5\' 4"  (1.626 m)   Wt 234 lb 4.8 oz (106.3 kg)   LMP 12/25/2016 (Approximate)   BMI 40.22 kg/m  Physical exam-deferred  ASSESSMENT: 1. Abnormal uterine bleeding on Nexplanon, with amenorrhea over the past 4 months 2. OCP side effects-headaches 3. History of migraines  PLAN: 1. Continue using the Nexplanon for contraception until July 2019 2. Return in July 2019 for Nexplanon removal 3. Patient may want to consider a 10 g oral contraceptive as follow on birth control after the Nexplanon is removed.  A total of 15 minutes were spent face-to-face with the patient during this encounter and over half of that time dealt with counseling and coordination of care.  Brayton Mars, MD  Note: This dictation was prepared  with Dragon dictation along with smaller phrase technology. Any transcriptional errors that result from this process are unintentional.

## 2017-05-22 NOTE — Patient Instructions (Signed)
1. Return in July 2019 for Nexplanon removal 2. Continue getting routine wellness exams at [place]

## 2018-03-05 ENCOUNTER — Encounter: Payer: Self-pay | Admitting: Obstetrics and Gynecology

## 2018-03-05 ENCOUNTER — Ambulatory Visit (INDEPENDENT_AMBULATORY_CARE_PROVIDER_SITE_OTHER): Payer: Medicaid Other | Admitting: Obstetrics and Gynecology

## 2018-03-05 VITALS — BP 106/70 | HR 93 | Ht 64.0 in | Wt 240.8 lb

## 2018-03-05 DIAGNOSIS — Z3049 Encounter for surveillance of other contraceptives: Secondary | ICD-10-CM | POA: Diagnosis not present

## 2018-03-05 DIAGNOSIS — Z3046 Encounter for surveillance of implantable subdermal contraceptive: Secondary | ICD-10-CM

## 2018-03-05 MED ORDER — ETONOGESTREL-ETHINYL ESTRADIOL 0.12-0.015 MG/24HR VA RING: VAGINAL_RING | VAGINAL | 3 refills | Status: DC

## 2018-03-05 NOTE — Progress Notes (Signed)
Chief complaint: 1.  Nexplanon removal 2.  Other contraceptive management for regulation of bleeding  Meagan Powers presents today for Nexplanon removal.  She has been amenorrheic on this hormonal contraception. Discussion of alternative hormonal regimens for regulation of bleeding were reviewed.  She had previously unsatisfactory experience with Mirena and Depo-Provera.  She is willing to go on a trial of the NuvaRing.  Past Medical History:  Diagnosis Date  . Abnormal uterine bleeding (AUB)   . Acanthosis nigricans   . AR (allergic rhinitis)   . Cervical dysplasia   . Dysmenorrhea   . Dyspareunia in female   . Incompetent cervix   . Increased BMI   . Migraine   . Migraine   . Pre-diabetes    Past Surgical History:  Procedure Laterality Date  . CERVICAL BIOPSY  W/ LOOP ELECTRODE EXCISION    . CERVICAL CERCLAGE     x 2  . CHOLECYSTECTOMY    . HYSTEROSCOPY W/D&C    . LAPAROSCOPY     Review of systems: Comprehensive review of systems is negative  OBJECTIVE: BP 106/70   Pulse 93   Ht 5\' 4"  (1.626 m)   Wt 240 lb 12.8 oz (109.2 kg)   BMI 41.33 kg/m  Well-appearing female no acute distress.  Alert and oriented. Left arm: Nexplanon palpable in the lower third of arm, medial aspect, adjacent to a superficial vein  PROCEDURE: Nexplanon removal Verbal consent is obtained.  Patient is placed in the supine position with the left arm externally rotated and flexed.  The Nexplanon removal site is cleansed with Hibiclens.  4 cc of 1% lidocaine without epinephrine is injected locally for anesthesia.  Stab wound is made adjacent to the distal aspect of the Nexplanon rod.  The rod is grasped with hemostat and scalpel is used to remove adhesions.  Rod is removed intact.  Pressure is applied to the incision site followed by application of Steri-Strip and Ace wrap.  Procedure is well-tolerated.  Blood loss is minimal.  ASSESSMENT: 1.  Successful Nexplanon removal 2.  Desire for hormonal  contraception for cycle regulation-NuvaRing sample given along with instructionss  PLAN: 1.  Nexplanon removed 2.  Hormonal contraception options discussed 3.  NuvaRing prescription and sample given-to be used continuously 84/7 regimen 4.  Maintain menstrual calendar monitoring 5.  Return in 3 months for follow-up  A total of 15 minutes were spent face-to-face with the patient during this encounter and over half of that time dealt with counseling and coordination of care.   Brayton Mars, MD  Note: This dictation was prepared with Dragon dictation along with smaller phrase technology. Any transcriptional errors that result from this process are unintentional.

## 2018-03-05 NOTE — Patient Instructions (Signed)
Nexplanon Instructions After removal   Keep bandage clean and dry for 24 hours   May use ice/Tylenol/Ibuprofen for soreness or pain   If you develop fever, drainage or increased warmth from incision site-contact office immediately   1.  Return in 3 months for follow-up 2.  Begin NuvaRing intravaginal every 21 days x 4 rings, then leave out for 1 week; repeat process

## 2018-03-20 ENCOUNTER — Encounter: Payer: Medicaid Other | Admitting: Obstetrics and Gynecology

## 2018-07-17 ENCOUNTER — Ambulatory Visit
Admission: RE | Admit: 2018-07-17 | Discharge: 2018-07-17 | Disposition: A | Discharge status: Home / Self Care | Payer: Medicaid Other | Source: Ambulatory Visit | Attending: Internal Medicine | Admitting: Internal Medicine

## 2018-07-17 ENCOUNTER — Other Ambulatory Visit: Payer: Self-pay | Admitting: Internal Medicine

## 2018-07-17 DIAGNOSIS — M545 Low back pain, unspecified: Secondary | ICD-10-CM

## 2018-07-28 ENCOUNTER — Other Ambulatory Visit: Payer: Self-pay | Admitting: Internal Medicine

## 2018-07-28 DIAGNOSIS — M545 Low back pain, unspecified: Secondary | ICD-10-CM

## 2018-07-28 DIAGNOSIS — R200 Anesthesia of skin: Secondary | ICD-10-CM

## 2018-08-02 ENCOUNTER — Ambulatory Visit
Admission: RE | Admit: 2018-08-02 | Discharge: 2018-08-02 | Disposition: A | Discharge status: Home / Self Care | Payer: Medicaid Other | Source: Ambulatory Visit | Attending: Internal Medicine | Admitting: Internal Medicine

## 2018-08-02 ENCOUNTER — Other Ambulatory Visit: Payer: Self-pay | Admitting: Internal Medicine

## 2018-08-02 DIAGNOSIS — M545 Low back pain, unspecified: Secondary | ICD-10-CM

## 2018-08-02 DIAGNOSIS — R200 Anesthesia of skin: Secondary | ICD-10-CM

## 2018-08-13 ENCOUNTER — Ambulatory Visit: Payer: Medicaid Other

## 2018-09-30 ENCOUNTER — Encounter: Payer: Self-pay | Admitting: Gastroenterology

## 2018-09-30 ENCOUNTER — Ambulatory Visit: Payer: Medicaid Other | Admitting: Gastroenterology

## 2018-09-30 VITALS — BP 110/74 | HR 97 | Ht 64.0 in | Wt 222.6 lb

## 2018-09-30 DIAGNOSIS — K625 Hemorrhage of anus and rectum: Secondary | ICD-10-CM

## 2018-09-30 DIAGNOSIS — K5900 Constipation, unspecified: Secondary | ICD-10-CM

## 2018-09-30 DIAGNOSIS — K602 Anal fissure, unspecified: Secondary | ICD-10-CM | POA: Diagnosis not present

## 2018-09-30 NOTE — Progress Notes (Signed)
Meagan Powers [place], [place] [address]  Main: [phone]  Fax: [phone]   Gastroenterology Consultation  Referring Provider:     Ellamae Sia, MD Primary Care Physician:  Ellamae Sia, MD Reason for Consultation:     Bright red blood per rectum        HPI:    Chief Complaint  Patient presents with  . New Patient (Initial Visit)    referred by Dr. Gilmore Laroche for BRBPR. Pt c/o hemorroids that are pain and itch.    Meagan Powers is a 33 y.o. y/o female referred for consultation & management  by Dr. Quay Burow, Ala Dach, MD.  Patient reports about 6 to 7 months ago she started having intermittent red blood per rectum, every week, also associated with anal pain with bowel movements.  Patient reports straining with bowel movements previously and now is using Dulcolax daily and bowel movements are soft.  Bright of blood per rectum has not reoccurred since December 2019, about a month ago.  No melena.  No abdominal pain.  No weight loss.  No nausea or vomiting.  No dysphagia.  No heartburn.  No prior colonoscopy.  No family history of colon cancer.  Past Medical History:  Diagnosis Date  . Abnormal uterine bleeding (AUB)   . Acanthosis nigricans   . AR (allergic rhinitis)   . Cervical dysplasia   . Dysmenorrhea   . Dyspareunia in female   . Incompetent cervix   . Increased BMI   . Migraine   . Migraine   . Pre-diabetes     Past Surgical History:  Procedure Laterality Date  . CERVICAL BIOPSY  W/ LOOP ELECTRODE EXCISION    . CERVICAL CERCLAGE     x 2  . CHOLECYSTECTOMY    . HYSTEROSCOPY W/D&C    . LAPAROSCOPY      Prior to Admission medications   Medication Sig Start Date End Date Taking? Authorizing Provider  EPINEPHrine 0.3 mg/0.3 mL IJ SOAJ injection Inject 0.3 mg into the muscle. 03/12/16  Yes [provider]  etonogestrel-ethinyl estradiol (NUVARING) 0.12-0.015 MG/24HR vaginal ring Insert NuvaRing  intravaginal every 21 days x 4 rings, then remove last ring for 7 days before restarting cycle 03/05/18  Yes Defrancesco, Alanda Slim, MD  fluticasone (FLONASE) 50 MCG/ACT nasal spray Place 2 sprays into both nostrils daily.   Yes [provider]  ibuprofen (ADVIL,MOTRIN) 800 MG tablet Take 800 mg by mouth every 8 (eight) hours as needed.   Yes [provider]  SUMAtriptan (IMITREX) 50 MG tablet Take 50 mg by mouth every 2 (two) hours as needed for migraine. May repeat in 2 hours if headache persists or recurs.   Yes [provider]    Family History  Problem Relation Age of Onset  . Hypertension Mother   . Cancer Neg Hx   . Diabetes Neg Hx   . Heart disease Neg Hx      Social History   Tobacco Use  . Smoking status: Never Smoker  . Smokeless tobacco: Never Used  Substance Use Topics  . Alcohol use: Yes    Comment: occas  . Drug use: No    Allergies as of 09/30/2018 - Review Complete 09/30/2018  Allergen Reaction Noted  . Shellfish allergy Anaphylaxis 09/06/2015    Review of Systems:    All systems reviewed and negative except where noted in HPI.   Physical Exam:  BP 110/74  Pulse 97   Ht 5\' 4"  (1.626 m)   Wt 222 lb 9.6 oz (101 kg)   BMI 38.21 kg/m  No LMP recorded. (Menstrual status: Other). Psych:  Alert and cooperative. Normal mood and affect. General:   Alert,  Well-developed, well-nourished, pleasant and cooperative in NAD Head:  Normocephalic and atraumatic. Eyes:  Sclera clear, no icterus.   Conjunctiva pink. Ears:  Normal auditory acuity. Nose:  No deformity, discharge, or lesions. Mouth:  No deformity or lesions,oropharynx pink & moist. Neck:  Supple; no masses or thyromegaly. Abdomen:  Normal bowel sounds.  No bruits.  Soft, non-tender and non-distended without masses, hepatosplenomegaly or hernias noted.  No guarding or rebound tenderness.  Rectal exam: 1 anal fissure noted on the posterior wall, no external hemorrhoid, patient  refused digital rectal exam, and only allowed evaluation of perianal area, due to pain Msk:  Symmetrical without gross deformities. Good, equal movement & strength bilaterally. Pulses:  Normal pulses noted. Extremities:  No clubbing or edema.  No cyanosis. Neurologic:  Alert and oriented x3;  grossly normal neurologically. Skin:  Intact without significant lesions or rashes. No jaundice. Lymph Nodes:  No significant cervical adenopathy. Psych:  Alert and cooperative. Normal mood and affect.   Labs: CBC    Component Value Date/Time   WBC 10.7 12/21/2012 1935   RBC 4.87 12/21/2012 1935   HGB 13.0 12/21/2012 1935   HCT 39.4 12/21/2012 1935   PLT 361 12/21/2012 1935   MCV 81 12/21/2012 1935   MCH 26.7 12/21/2012 1935   MCHC 33.1 12/21/2012 1935   RDW 13.6 12/21/2012 1935   CMP     Component Value Date/Time   NA 137 12/21/2012 1935   K 3.5 12/21/2012 1935   CL 105 12/21/2012 1935   CO2 23 12/21/2012 1935   GLUCOSE 108 (H) 12/21/2012 1935   BUN 13 12/21/2012 1935   CREATININE 0.94 12/21/2012 1935   CALCIUM 9.1 12/21/2012 1935   PROT 8.2 12/21/2012 1935   ALBUMIN 3.5 12/21/2012 1935   AST 18 12/21/2012 1935   ALT 27 12/21/2012 1935   ALKPHOS 99 12/21/2012 1935   BILITOT 0.2 12/21/2012 1935   GFRNONAA >60 12/21/2012 1935   GFRAA >60 12/21/2012 1935    Imaging Studies: No results found.  Assessment and Plan:   Meagan Powers is a 33 y.o. y/o female has been referred for intermittent bright red blood per rectum  Last episode of bright blood per rectum was a month ago Patient denies any recent blood work We will obtain CBC  Pain symptoms are most consistent with anal fissure seen on exam.  However, her bright blood per rectum may be due to internal hemorrhoids not seen today  Given her above symptoms, we discussed conservative management with nifedipine ointment for anal fissure, high-fiber diet and MiraLAX for possible underlying internal hemorrhoids, versus  colonoscopy for further evaluation to rule out colon malignancy.  Patient prefers to proceed with colonoscopy to rule out any underlying lesions after discussing risk of benefits of above options.  I have discussed alternative options, risks & benefits,  which include, but are not limited to, bleeding, infection, perforation,respiratory complication & drug reaction.  The patient agrees with this plan & written consent will be obtained.    We will try to contact specialty pharmacy to see if nifedipine ointment can be obtained  In the meantime, ultimate treatment would still be high-fiber diet to prevent straining which patient has been doing with Dulcolax daily. High-fiber diet  MiraLAX or Metamucil daily with goal of 1-2 soft bowel movements daily.  If not at goal, patient instructed to increase dose to twice daily.  If loose stools with the medication, patient asked to decrease the medication to every other day, or half dose daily.  Patient verbalized understanding      Dr Meagan Powers  Speech recognition software was used to dictate the above note.

## 2018-09-30 NOTE — Patient Instructions (Signed)
Miralax or Metamucil daily (over the counter) Nifedipine topical oint. 2 times daily (will call [facility]   High-Fiber Diet Fiber, also called dietary fiber, is a type of carbohydrate that is found in fruits, vegetables, whole grains, and beans. A high-fiber diet can have many health benefits. Your health care provider may recommend a high-fiber diet to help:  Prevent constipation. Fiber can make your bowel movements more regular.  Lower your cholesterol.  Relieve the following conditions: ? Swelling of veins in the anus (hemorrhoids). ? Swelling and irritation (inflammation) of specific areas of the digestive tract (uncomplicated diverticulosis). ? A problem of the large intestine (colon) that sometimes causes pain and diarrhea (irritable bowel syndrome, IBS).  Prevent overeating as part of a weight-loss plan.  Prevent heart disease, type 2 diabetes, and certain cancers. What is my plan? The recommended daily fiber intake in grams (g) includes:  38 g for men age 76 or younger.  30 g for men over age 47.  63 g for women age 45 or younger.  21 g for women over age 63. You can get the recommended daily intake of dietary fiber by:  Eating a variety of fruits, vegetables, grains, and beans.  Taking a fiber supplement, if it is not possible to get enough fiber through your diet. What do I need to know about a high-fiber diet?  It is better to get fiber through food sources rather than from fiber supplements. There is not a lot of research about how effective supplements are.  Always check the fiber content on the nutrition facts label of any prepackaged food. Look for foods that contain 5 g of fiber or more per serving.  Talk with a diet and nutrition specialist (dietitian) if you have questions about specific foods that are recommended or not recommended for your medical condition, especially if those foods are not listed below.  Gradually increase how much fiber you  consume. If you increase your intake of dietary fiber too quickly, you may have bloating, cramping, or gas.  Drink plenty of water. Water helps you to digest fiber. What are tips for following this plan?  Eat a wide variety of high-fiber foods.  Make sure that half of the grains that you eat each day are whole grains.  Eat breads and cereals that are made with whole-grain flour instead of refined flour or white flour.  Eat brown rice, bulgur wheat, or millet instead of white rice.  Start the day with a breakfast that is high in fiber, such as a cereal that contains 5 g of fiber or more per serving.  Use beans in place of meat in soups, salads, and pasta dishes.  Eat high-fiber snacks, such as berries, raw vegetables, nuts, and popcorn.  Choose whole fruits and vegetables instead of processed forms like juice or sauce. What foods can I eat?  Fruits Berries. Pears. Apples. Oranges. Avocado. Prunes and raisins. Dried figs. Vegetables Sweet potatoes. Spinach. Kale. Artichokes. Cabbage. Broccoli. Cauliflower. Green peas. Carrots. Squash. Grains Whole-grain breads. Multigrain cereal. Oats and oatmeal. Brown rice. Barley. Bulgur wheat. [place]. Quinoa. Bran muffins. Popcorn. Rye wafer crackers. Meats and other proteins Navy, kidney, and pinto beans. Soybeans. Split peas. Lentils. Nuts and seeds. Dairy Fiber-fortified yogurt. Beverages Fiber-fortified soy milk. Fiber-fortified orange juice. Other foods Fiber bars. The items listed above may not be a complete list of recommended foods and beverages. Contact a dietitian for more options. What foods are not recommended? Fruits Fruit juice. Cooked, strained fruit. Vegetables  Fried potatoes. Canned vegetables. Well-cooked vegetables. Grains White bread. Pasta made with refined flour. White rice. Meats and other proteins Fatty cuts of meat. Fried chicken or fried fish. Dairy Milk. Yogurt. Cream cheese. Sour cream. Fats and  oils Butters. Beverages Soft drinks. Other foods Cakes and pastries. The items listed above may not be a complete list of foods and beverages to avoid. Contact a dietitian for more information. Summary  Fiber is a type of carbohydrate. It is found in fruits, vegetables, whole grains, and beans.  There are many health benefits of eating a high-fiber diet, such as preventing constipation, lowering blood cholesterol, helping with weight loss, and reducing your risk of heart disease, diabetes, and certain cancers.  Gradually increase your intake of fiber. Increasing too fast can result in cramping, bloating, and gas. Drink plenty of water while you increase your fiber.  The best sources of fiber include whole fruits and vegetables, whole grains, nuts, seeds, and beans. This information is not intended to replace advice given to you by your health care provider. Make sure you discuss any questions you have with your health care provider. Document Released: 08/13/2005 Document Revised: 06/17/2017 Document Reviewed: 06/17/2017 Elsevier Interactive Patient Education  2019 [employer].

## 2018-10-01 LAB — CBC
HEMATOCRIT: 41.6 % (ref 34.0–46.6)
Hemoglobin: 13.8 g/dL (ref 11.1–15.9)
MCH: 26.7 pg (ref 26.6–33.0)
MCHC: 33.2 g/dL (ref 31.5–35.7)
MCV: 81 fL (ref 79–97)
PLATELETS: 419 10*3/uL (ref 150–450)
RBC: 5.17 x10E6/uL (ref 3.77–5.28)
RDW: 12.5 % (ref 11.7–15.4)
WBC: 9.2 10*3/uL (ref 3.4–10.8)

## 2018-10-02 ENCOUNTER — Other Ambulatory Visit: Payer: Self-pay

## 2018-10-02 DIAGNOSIS — K625 Hemorrhage of anus and rectum: Secondary | ICD-10-CM

## 2018-10-06 ENCOUNTER — Other Ambulatory Visit: Payer: Self-pay

## 2018-10-06 DIAGNOSIS — K625 Hemorrhage of anus and rectum: Secondary | ICD-10-CM

## 2018-10-14 ENCOUNTER — Encounter: Payer: Self-pay | Admitting: *Deleted

## 2018-10-15 ENCOUNTER — Ambulatory Visit: Payer: Medicaid Other | Admitting: Anesthesiology

## 2018-10-15 ENCOUNTER — Encounter
Admission: RE | Disposition: A | Discharge status: Home / Self Care | Payer: Self-pay | Source: Home / Self Care | Attending: Gastroenterology

## 2018-10-15 ENCOUNTER — Ambulatory Visit
Admission: RE | Admit: 2018-10-15 | Discharge: 2018-10-15 | Disposition: A | Discharge status: Home / Self Care | Payer: Medicaid Other | Attending: Gastroenterology | Admitting: Gastroenterology

## 2018-10-15 ENCOUNTER — Encounter: Payer: Self-pay | Admitting: *Deleted

## 2018-10-15 DIAGNOSIS — G43909 Migraine, unspecified, not intractable, without status migrainosus: Secondary | ICD-10-CM | POA: Insufficient documentation

## 2018-10-15 DIAGNOSIS — Z7951 Long term (current) use of inhaled steroids: Secondary | ICD-10-CM | POA: Diagnosis not present

## 2018-10-15 DIAGNOSIS — K921 Melena: Secondary | ICD-10-CM | POA: Diagnosis present

## 2018-10-15 DIAGNOSIS — Z79890 Hormone replacement therapy (postmenopausal): Secondary | ICD-10-CM | POA: Diagnosis not present

## 2018-10-15 DIAGNOSIS — Z79899 Other long term (current) drug therapy: Secondary | ICD-10-CM | POA: Diagnosis not present

## 2018-10-15 DIAGNOSIS — K602 Anal fissure, unspecified: Secondary | ICD-10-CM

## 2018-10-15 DIAGNOSIS — K625 Hemorrhage of anus and rectum: Secondary | ICD-10-CM

## 2018-10-15 HISTORY — PX: COLONOSCOPY WITH PROPOFOL: SHX5780

## 2018-10-15 LAB — POCT PREGNANCY, URINE: Preg Test, Ur: NEGATIVE

## 2018-10-15 SURGERY — COLONOSCOPY WITH PROPOFOL
Anesthesia: General

## 2018-10-15 MED ORDER — PROPOFOL 10 MG/ML IV BOLUS
INTRAVENOUS | Status: DC | PRN
  Administered 2018-10-15: 50 mg via INTRAVENOUS
  Administered 2018-10-15: 100 mg via INTRAVENOUS

## 2018-10-15 MED ORDER — FENTANYL CITRATE (PF) 100 MCG/2ML IJ SOLN
INTRAMUSCULAR | Status: AC
  Filled 2018-10-15: qty 2

## 2018-10-15 MED ORDER — FENTANYL CITRATE (PF) 100 MCG/2ML IJ SOLN
INTRAMUSCULAR | Status: DC | PRN
  Administered 2018-10-15 (×2): 50 ug via INTRAVENOUS

## 2018-10-15 MED ORDER — SODIUM CHLORIDE 0.9 % IV SOLN
INTRAVENOUS | Status: DC
  Administered 2018-10-15: 1000 mL via INTRAVENOUS

## 2018-10-15 MED ORDER — LIDOCAINE 2% (20 MG/ML) 5 ML SYRINGE
INTRAMUSCULAR | Status: DC | PRN
  Administered 2018-10-15: 30 mg via INTRAVENOUS

## 2018-10-15 MED ORDER — PROPOFOL 500 MG/50ML IV EMUL
INTRAVENOUS | Status: AC
  Filled 2018-10-15: qty 50

## 2018-10-15 MED ORDER — PROPOFOL 500 MG/50ML IV EMUL
INTRAVENOUS | Status: DC | PRN
  Administered 2018-10-15: 180 ug/kg/min via INTRAVENOUS

## 2018-10-15 NOTE — Transfer of Care (Signed)
Immediate Anesthesia Transfer of Care Note  Patient: Modena Morrow Pompei  Procedure(s) Performed: COLONOSCOPY WITH PROPOFOL (N/A )  Patient Location: PACU  Anesthesia Type:General  Level of Consciousness: awake, alert  and oriented  Airway & Oxygen Therapy: Patient Spontanous Breathing and Patient connected to nasal cannula oxygen  Post-op Assessment: Report given to RN and Post -op Vital signs reviewed and stable  Post vital signs: Reviewed and stable  Last Vitals:  Vitals Value Taken Time  BP 134/122 10/15/2018 11:41 AM  Temp 36.1 C 10/15/2018 11:41 AM  Pulse 99 10/15/2018 11:43 AM  Resp 20 10/15/2018 11:43 AM  SpO2 100 % 10/15/2018 11:43 AM  Vitals shown include unvalidated device data.  Last Pain:  Vitals:   10/15/18 1141  TempSrc: Tympanic  PainSc: Asleep         Complications: No apparent anesthesia complications

## 2018-10-15 NOTE — Anesthesia Postprocedure Evaluation (Signed)
Anesthesia Post Note  Patient: Radio producer  Procedure(s) Performed: COLONOSCOPY WITH PROPOFOL (N/A )  Patient location during evaluation: Endoscopy Anesthesia Type: General Level of consciousness: awake and alert Pain management: pain level controlled Vital Signs Assessment: post-procedure vital signs reviewed and stable Respiratory status: spontaneous breathing, nonlabored ventilation and respiratory function stable Cardiovascular status: blood pressure returned to baseline and stable Postop Assessment: no apparent nausea or vomiting Anesthetic complications: no     Last Vitals:  Vitals:   10/15/18 1151 10/15/18 1211  BP: 116/75 105/73  Pulse:    Resp:    Temp:    SpO2:      Last Pain:  Vitals:   10/15/18 1211  TempSrc:   PainSc: 0-No pain                 Alphonsus Sias

## 2018-10-15 NOTE — Anesthesia Preprocedure Evaluation (Addendum)
Anesthesia Evaluation  Patient identified by MRN, date of birth, ID band Patient awake    Reviewed: Allergy & Precautions, NPO status , Patient's Chart, lab work & pertinent test results  History of Anesthesia Complications Negative for: history of anesthetic complications  Airway Mallampati: III       Dental   Pulmonary neg sleep apnea, neg COPD,           Cardiovascular (-) hypertension(-) Past MI and (-) CHF (-) dysrhythmias (-) Valvular Problems/Murmurs     Neuro/Psych neg Seizures    GI/Hepatic Neg liver ROS, neg GERD  ,  Endo/Other  neg diabetes  Renal/GU negative Renal ROS     Musculoskeletal   Abdominal (+) + obese,   Peds  Hematology   Anesthesia Other Findings   Reproductive/Obstetrics                             Anesthesia Physical Anesthesia Plan  ASA: II  Anesthesia Plan: General   Post-op Pain Management:    Induction: Intravenous  PONV Risk Score and Plan: 3 and Propofol infusion, TIVA and Midazolam  Airway Management Planned: Nasal Cannula  Additional Equipment:   Intra-op Plan:   Post-operative Plan:   Informed Consent: I have reviewed the patients History and Physical, chart, labs and discussed the procedure including the risks, benefits and alternatives for the proposed anesthesia with the patient or authorized representative who has indicated his/her understanding and acceptance.       Plan Discussed with:   Anesthesia Plan Comments:         Anesthesia Quick Evaluation

## 2018-10-15 NOTE — Anesthesia Post-op Follow-up Note (Signed)
Anesthesia QCDR form completed.        

## 2018-10-15 NOTE — Op Note (Signed)
[facility] Patient Name: Meagan Powers Procedure Date: 10/15/2018 11:00 AM MRN: [id number] Account #: [id number] Date of Birth: May 31, 1986 Admit Type: Outpatient Age: 33 Room: [facility] ENDO ROOM 2 Gender: Female Note Status: Finalized Procedure:            Colonoscopy Indications:          Hematochezia Providers:            Whitlee Sluder B. Bonna Gains MD, MD Referring MD:         Remus Blake MD, MD (Referring MD) Medicines:            Monitored Anesthesia Care Complications:        No immediate complications. Procedure:            Pre-Anesthesia Assessment:                       - Prior to the procedure, a History and Physical was                        performed, and patient medications, allergies and                        sensitivities were reviewed. The patient's tolerance of                        previous anesthesia was reviewed.                       - The risks and benefits of the procedure and the                        sedation options and risks were discussed with the                        patient. All questions were answered and informed                        consent was obtained.                       - Patient identification and proposed procedure were                        verified prior to the procedure by the physician, the                        nurse, the anesthetist and the technician. The                        procedure was verified in the pre-procedure area in the                        procedure room in the endoscopy suite.                       - ASA Grade Assessment: II - A patient with mild                        systemic disease.                       -  After reviewing the risks and benefits, the patient                        was deemed in satisfactory condition to undergo the                        procedure.                       After obtaining informed consent, the colonoscope was                        passed  under direct vision. Throughout the procedure,                        the patient's blood pressure, pulse, and oxygen                        saturations were monitored continuously. The                        Colonoscope was introduced through the anus and                        advanced to the the cecum, identified by appendiceal                        orifice and ileocecal valve. The colonoscopy was                        performed with ease. The patient tolerated the                        procedure well. The quality of the bowel preparation                        was fair. Findings:      The perianal and digital rectal examinations were normal.      A 4 mm anal fissure was found in the anal canal.      The rectum, sigmoid colon, descending colon, transverse colon, ascending       colon and cecum appeared normal.      The retroflexed view of the distal rectum and anal verge was normal and       showed no anal or rectal abnormalities. Impression:           - Preparation of the colon was fair.                       - Anal fissure.                       - The rectum, sigmoid colon, descending colon,                        transverse colon, ascending colon and cecum are normal.                       - The distal rectum and anal verge are normal on  retroflexion view.                       - No specimens collected. Recommendation:       - Discharge patient to home.                       - Resume previous diet.                       - Continue present medications.                       - Repeat colonoscopy 11 years (at 33 years of age) for                        screening purposes.                       - Return to primary care physician as previously                        scheduled.                       - The findings and recommendations were discussed with                        the patient.                       - The findings and recommendations were discussed  with                        the patient's family.                       - In the future, if patient develops new symptoms such                        as blood per rectum, abdominal pain, weight loss,                        altered bowel habits or any other reason for concern,                        patient should discuss this with thier PCP as they may                        need a GI referral at that time or evaluation for need                        for colonoscopy earlier than the recommended screening                        colonoscopy.                       In addition, if patient's family history of colon                        cancer changes (no family history at this time) in the  future, earlier screening may be indicated and patient                        should discuss this with PCP as well.                       - High fiber diet.                       - Nifedipine ointment rectally as prescribed in clinic                        previously. Procedure Code(s):    --- Professional ---                       [phone], Colonoscopy, flexible; diagnostic, including                        collection of specimen(s) by brushing or washing, when                        performed (separate procedure) Diagnosis Code(s):    --- Professional ---                       K60.2, Anal fissure, unspecified                       K92.1, Melena (includes Hematochezia) CPT copyright 2018 American Medical Association. All rights reserved. The codes documented in this report are preliminary and upon coder review may  be revised to meet current compliance requirements.  Vonda Antigua, MD Margretta Sidle B. Bonna Gains MD, MD 10/15/2018 11:41:48 AM This report has been signed electronically. Number of Addenda: 0 Note Initiated On: 10/15/2018 11:00 AM Estimated Blood Loss: Estimated blood loss: none.      [facility]

## 2018-10-15 NOTE — H&P (Signed)
Meagan Antigua, MD [address] [place], [place], [place], [address] [address], [place], [place], [place], [address] Phone: [phone]  Fax: [phone]  Primary Care Physician:  Ellamae Sia, MD   Pre-Procedure History & Physical: HPI:  Meagan Powers is a 33 y.o. female is here for a colonoscopy.   Past Medical History:  Diagnosis Date  . Abnormal uterine bleeding (AUB)   . Acanthosis nigricans   . AR (allergic rhinitis)   . Cervical dysplasia   . Dysmenorrhea   . Dyspareunia in female   . Incompetent cervix   . Increased BMI   . Migraine   . Migraine   . Pre-diabetes     Past Surgical History:  Procedure Laterality Date  . CERVICAL BIOPSY  W/ LOOP ELECTRODE EXCISION    . CERVICAL CERCLAGE     x 2  . CHOLECYSTECTOMY    . DILATION AND CURETTAGE OF UTERUS    . HYSTEROSCOPY W/D&C    . LAPAROSCOPY      Prior to Admission medications   Medication Sig Start Date End Date Taking? Authorizing Provider  EPINEPHrine 0.3 mg/0.3 mL IJ SOAJ injection Inject 0.3 mg into the muscle. 03/12/16   [provider]  etonogestrel-ethinyl estradiol (NUVARING) 0.12-0.015 MG/24HR vaginal ring Insert NuvaRing intravaginal every 21 days x 4 rings, then remove last ring for 7 days before restarting cycle 03/05/18   Defrancesco, Alanda Slim, MD  fluticasone (FLONASE) 50 MCG/ACT nasal spray Place 2 sprays into both nostrils daily.    [provider]  ibuprofen (ADVIL,MOTRIN) 800 MG tablet Take 800 mg by mouth every 8 (eight) hours as needed.    [provider]  SUMAtriptan (IMITREX) 50 MG tablet Take 50 mg by mouth every 2 (two) hours as needed for migraine. May repeat in 2 hours if headache persists or recurs.    [provider]    Allergies as of 10/02/2018 - Review Complete 09/30/2018  Allergen Reaction Noted  . 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 Hypertension Mother   . Cancer  Neg Hx   . Diabetes Neg Hx   . Heart disease Neg Hx     Social History   Socioeconomic History  . Marital status: Married    Spouse name: Not on file  . Number of children: Not on file  . Years of education: Not on file  . Highest education level: Not on file  Occupational History  . Not on file  Social Needs  . Financial resource strain: Not on file  . Food insecurity:    Worry: Not on file    Inability: Not on file  . Transportation needs:    Medical: Not on file    Non-medical: Not on file  Tobacco Use  . Smoking status: Never Smoker  . Smokeless tobacco: Never Used  Substance and Sexual Activity  . Alcohol use: Yes    Comment: occas  . Drug use: No  . Sexual activity: Yes    Birth control/protection: Implant  Lifestyle  . Physical activity:    Days per week: Not on file    Minutes per session: Not on file  . Stress: Not on file  Relationships  . Social connections:    Talks on phone: Not on file    Gets together: Not on file    Attends religious service: Not on file    Active member of club or organization: Not  on file    Attends meetings of clubs or organizations: Not on file    Relationship status: Not on file  . Intimate partner violence:    Fear of current or ex partner: Not on file    Emotionally abused: Not on file    Physically abused: Not on file    Forced sexual activity: Not on file  Other Topics Concern  . Not on file  Social History Narrative  . Not on file    Review of Systems: See HPI, otherwise negative ROS  Physical Exam: BP 123/69   Pulse 99   Temp 97.7 F (36.5 C) (Tympanic)   Resp 20   Ht 5\' 4"  (1.626 m)   Wt 100.9 kg   SpO2 100%   BMI 38.18 kg/m  General:   Alert,  pleasant and cooperative in NAD Head:  Normocephalic and atraumatic. Neck:  Supple; no masses or thyromegaly. Lungs:  Clear throughout to auscultation, normal respiratory effort.    Heart:  +S1, +S2, Regular rate and rhythm, No edema. Abdomen:  Soft,  nontender and nondistended. Normal bowel sounds, without guarding, and without rebound.   Neurologic:  Alert and  oriented x4;  grossly normal neurologically.  Impression/Plan: Meagan Powers is here for a colonoscopy to be performed for BRBPR  Risks, benefits, limitations, and alternatives regarding  colonoscopy have been reviewed with the patient.  Questions have been answered.  All parties agreeable.   Virgel Manifold, MD  10/15/2018, 11:16 AM

## 2018-10-16 ENCOUNTER — Encounter: Payer: Self-pay | Admitting: Gastroenterology

## 2018-10-30 ENCOUNTER — Telehealth: Payer: Self-pay | Admitting: Gastroenterology

## 2018-10-30 NOTE — Telephone Encounter (Signed)
Pt is calling Dr. Darene Lamer wanted to send in an rx [place] for pt Pharmacy does not have it please send to [facility] [place] rx Nifeditine 30  0.2% oint

## 2018-10-31 NOTE — Telephone Encounter (Signed)
Patient returned call & needs refill. She Spoke with Dr Bonna Gains at time of procedure. Patient was using 3x's day per doctor & has run out. She needs a new prescription to reflect 3x's a day called into [facility].

## 2018-10-31 NOTE — Telephone Encounter (Signed)
I called Meagan Powers's drug and the medication was filled and picked up on 09/30/2018. Left pt message to contact office for clarification as to whether or not a refill is needed or what?

## 2018-11-06 ENCOUNTER — Other Ambulatory Visit: Payer: Self-pay

## 2018-11-06 NOTE — Telephone Encounter (Signed)
Pt notified of rx sent to [facility] for Nifeditine 30 0.2%, 3x day, x30 days, no refills. Pt will contact office if she wishes to have referral to [place] [facility] for anal fissure if this round of medication does not work.

## 2019-01-05 ENCOUNTER — Ambulatory Visit (INDEPENDENT_AMBULATORY_CARE_PROVIDER_SITE_OTHER): Payer: Medicaid Other | Admitting: Gastroenterology

## 2019-01-05 ENCOUNTER — Encounter: Payer: Self-pay | Admitting: Gastroenterology

## 2019-01-05 DIAGNOSIS — K5900 Constipation, unspecified: Secondary | ICD-10-CM

## 2019-01-05 DIAGNOSIS — Z8719 Personal history of other diseases of the digestive system: Secondary | ICD-10-CM

## 2019-01-05 NOTE — Progress Notes (Signed)
Meagan Antigua, MD [address]  [place]  [place], [place] [address]  Main: [phone]  Fax: [phone]   Primary Care Physician: Meagan Sia, MD  Virtual Visit via Video Note  I connected with patient on 01/05/19 at 10:00 AM EDT by video (using [email or web address]) and verified that I am speaking with the correct person using two identifiers.   I discussed the limitations, risks, security and privacy concerns of performing an evaluation and management service by video and the availability of in person appointments. I also discussed with the patient that there may be a patient responsible charge related to this service. The patient expressed understanding and agreed to proceed.  Location of Patient: Home Location of Provider: Home Persons involved: Patient and provider only (Nursing staff checked in patient via phone but were not physically involved in the video interaction - see their notes)   History of Present Illness: Chief Complaint  Patient presents with  . Follow-up    constipation    HPI: Meagan Powers is a 33 y.o. female previously seen for bright red blood per rectum and was found to have an anal fissure.  Patient has undergone colonoscopy that was normal besides the anal fissure.  Patient was prescribed nifedipine appointment, high-fiber diet and MiraLAX, and now reports resolution of anal pain that she was experiencing on a daily basis.  Has not had any further anal pain.  Does report intermittent anal itching.  Bright red blood per rectum used to be daily, and is now occurring only very intermittently maybe once a month and only on toilet paper.  The patient denies abdominal or flank pain, anorexia, nausea or vomiting, dysphagia, change in bowel habits or black or bloody stools or weight loss.  With increased water intake and high-fiber diet, she has now been able to decrease her MiraLAX to 3 times a week, and uses stool softeners intermittently every other  day if needed only.  Reports 1-2 bowel movements a day that are usually soft.  Does report straining very occasionally.  Current Outpatient Medications  Medication Sig Dispense Refill  . EPINEPHrine 0.3 mg/0.3 mL IJ SOAJ injection Inject 0.3 mg into the muscle.    . etonogestrel-ethinyl estradiol (NUVARING) 0.12-0.015 MG/24HR vaginal ring Insert NuvaRing intravaginal every 21 days x 4 rings, then remove last ring for 7 days before restarting cycle 4 each 3  . fluticasone (FLONASE) 50 MCG/ACT nasal spray Place 2 sprays into both nostrils daily.    Marland Kitchen ibuprofen (ADVIL,MOTRIN) 800 MG tablet Take 800 mg by mouth every 8 (eight) hours as needed.    . SUMAtriptan (IMITREX) 50 MG tablet Take 50 mg by mouth every 2 (two) hours as needed for migraine. May repeat in 2 hours if headache persists or recurs.     No current facility-administered medications for this visit.     Allergies as of 01/05/2019 - Review Complete 01/05/2019  Allergen Reaction Noted  . Shellfish allergy Anaphylaxis 09/06/2015  . Iodine  10/15/2018    Review of Systems:    All systems reviewed and negative except where noted in HPI.   Observations/Objective:  Labs: CMP     Component Value Date/Time   NA 137 12/21/2012 1935   K 3.5 12/21/2012 1935   CL 105 12/21/2012 1935   CO2 23 12/21/2012 1935   GLUCOSE 108 (H) 12/21/2012 1935   BUN 13 12/21/2012 1935   CREATININE 0.94 12/21/2012 1935   CALCIUM 9.1 12/21/2012 1935   PROT 8.2  12/21/2012 1935   ALBUMIN 3.5 12/21/2012 1935   AST 18 12/21/2012 1935   ALT 27 12/21/2012 1935   ALKPHOS 99 12/21/2012 1935   BILITOT 0.2 12/21/2012 1935   GFRNONAA >60 12/21/2012 1935   GFRAA >60 12/21/2012 1935   Lab Results  Component Value Date   WBC 9.2 09/30/2018   HGB 13.8 09/30/2018   HCT 41.6 09/30/2018   MCV 81 09/30/2018   PLT 419 09/30/2018    Imaging Studies: No results found.  Assessment and Plan:   [name] is a 33 y.o. y/o female with history of anal  fissure  Assessment and Plan: Anal fissure now symptomatically resolved, with no further anal pain as per patient.  Continue high-fiber diet I have asked the patient to increase the MiraLAX to daily or at least 4 times a week given that she still has some episodes of straining and also has anal itching.  can use over-the-counter ointments or creams for anal itching as well.  No alarm symptoms present  Previous symptoms have resolved or significantly improved  Screening colonoscopy in 10 years at 33 years of age was discussed.  If symptoms return or if patient has any further questions or concerns she was asked to notify us and she verbalized understanding  Follow Up Instructions: Follow-up as needed   I discussed the assessment and treatment plan with the patient. The patient was provided an opportunity to ask questions and all were answered. The patient agreed with the plan and demonstrated an understanding of the instructions.   The patient was advised to call back or seek an in-person evaluation if the symptoms worsen or if the condition fails to improve as anticipated.  I provided 15 minutes of face-to-face time via video software during this encounter.   Meagan Manifold, MD  Speech recognition software was used to dictate this note.

## 2019-01-13 ENCOUNTER — Ambulatory Visit: Payer: Medicaid Other | Admitting: Gastroenterology

## 2019-02-11 ENCOUNTER — Encounter: Payer: Self-pay | Admitting: Obstetrics and Gynecology

## 2019-02-11 ENCOUNTER — Other Ambulatory Visit: Payer: Self-pay

## 2019-02-11 ENCOUNTER — Ambulatory Visit (INDEPENDENT_AMBULATORY_CARE_PROVIDER_SITE_OTHER): Payer: Medicaid Other | Admitting: Obstetrics and Gynecology

## 2019-02-11 VITALS — BP 119/78 | HR 102 | Ht 62.0 in | Wt 218.3 lb

## 2019-02-11 DIAGNOSIS — B373 Candidiasis of vulva and vagina: Secondary | ICD-10-CM

## 2019-02-11 DIAGNOSIS — Z3009 Encounter for other general counseling and advice on contraception: Secondary | ICD-10-CM

## 2019-02-11 DIAGNOSIS — B3731 Acute candidiasis of vulva and vagina: Secondary | ICD-10-CM

## 2019-02-11 MED ORDER — TERCONAZOLE 0.4 % VA CREA: 1.0000 | TOPICAL_CREAM | Freq: Every day | VAGINAL | 0 refills | Status: AC

## 2019-02-11 NOTE — Progress Notes (Signed)
HPI:      [name] is a 33 y.o. [phone] who LMP was No LMP recorded. (Menstrual status: Other).  Subjective:   She presents today with complaint of vulvar and vaginal itching which she describes as severe. This all started approximately a week ago when she began having symptoms of a bladder infection.  This was her first bladder infection.  She had a telephone visit where they prescribed Cipro.  She states that it took a few days for the Cipro to begin working and she was miserable during this time.  She was also given Diflucan for possible yeast infection and she has taken 1 of those.  She states that she has vulvar swelling, rash vaginal discharge and itching.  She is very uncomfortable. Also of significant note patient has been using NuvaRing but is not very fond of it.  She previously tried an IUD which "became crooked".  She is considering retrying the IUD.    Hx: The following portions of the patient's history were reviewed and updated as appropriate:             She  has a past medical history of Abnormal uterine bleeding (AUB), Acanthosis nigricans, AR (allergic rhinitis), Cervical dysplasia, Dysmenorrhea, Dyspareunia in female, Incompetent cervix, Increased BMI, Migraine, Migraine, and Pre-diabetes. She does not have any pertinent problems on file. She  has a past surgical history that includes Hysteroscopy w/D&C; laparoscopy; Cervical biopsy w/ loop electrode excision; Cervical cerclage; Cholecystectomy; Dilation and curettage of uterus; and Colonoscopy with propofol (N/A, 10/15/2018). Her family history includes Hypertension in her mother. She  reports that she has never smoked. She has never used smokeless tobacco. She reports current alcohol use. She reports that she does not use drugs. She has a current medication list which includes the following prescription(s): epinephrine, etonogestrel-ethinyl estradiol, fluticasone, ibuprofen, sumatriptan, and terconazole. She is  allergic to shellfish allergy and iodine.       Review of Systems:  Review of Systems  Constitutional: Denied constitutional symptoms, night sweats, recent illness, fatigue, fever, insomnia and weight loss.  Eyes: Denied eye symptoms, eye pain, photophobia, vision change and visual disturbance.  Ears/Nose/Throat/Neck: Denied ear, nose, throat or neck symptoms, hearing loss, nasal discharge, sinus congestion and sore throat.  Cardiovascular: Denied cardiovascular symptoms, arrhythmia, chest pain/pressure, edema, exercise intolerance, orthopnea and palpitations.  Respiratory: Denied pulmonary symptoms, asthma, pleuritic pain, productive sputum, cough, dyspnea and wheezing.  Gastrointestinal: Denied, gastro-esophageal reflux, melena, nausea and vomiting.  Genitourinary: See HPI for additional information.  Musculoskeletal: Denied musculoskeletal symptoms, stiffness, swelling, muscle weakness and myalgia.  Dermatologic: Denied dermatology symptoms, rash and scar.  Neurologic: Denied neurology symptoms, dizziness, headache, neck pain and syncope.  Psychiatric: Denied psychiatric symptoms, anxiety and depression.  Endocrine: Denied endocrine symptoms including hot flashes and night sweats.   Meds:   Current Outpatient Medications on File Prior to Visit  Medication Sig Dispense Refill  . EPINEPHrine 0.3 mg/0.3 mL IJ SOAJ injection Inject 0.3 mg into the muscle.    . etonogestrel-ethinyl estradiol (NUVARING) 0.12-0.015 MG/24HR vaginal ring Insert NuvaRing intravaginal every 21 days x 4 rings, then remove last ring for 7 days before restarting cycle 4 each 3  . fluticasone (FLONASE) 50 MCG/ACT nasal spray Place 2 sprays into both nostrils daily.    Marland Kitchen ibuprofen (ADVIL,MOTRIN) 800 MG tablet Take 800 mg by mouth every 8 (eight) hours as needed.    . SUMAtriptan (IMITREX) 50 MG tablet Take 50 mg by mouth every 2 (two) hours  as needed for migraine. May repeat in 2 hours if headache persists or recurs.      No current facility-administered medications on file prior to visit.     Objective:     Vitals:   02/11/19 1456  BP: 119/78  Pulse: (!) 102              Physical examination   Pelvic:   Vulva:  No specific rashes noted and no ulcerative lesions however entire vulva appears irritated and swollen.  No lesions.  Vagina: No lesions or abnormalities noted.  Thick white vaginal discharge  Support: Normal pelvic support.  Urethra No masses tenderness or scarring.  Meatus Normal size without lesions or prolapse.  Cervix: Normal appearance.  No lesions.  Anus: Normal exam.  No lesions.  Perineum: Normal exam.  No lesions.        Bimanual   Uterus: Normal size.  Non-tender.  Mobile.  AV.  Adnexae: No masses.  Non-tender to palpation.  Cul-de-sac: Negative for abnormality.   WET PREP: clue cells: present, KOH (yeast): positive, odor: absent and trichomoniasis: negative Ph:  < 4.5   Assessment:    [id number] Patient Active Problem List   Diagnosis Date Noted  . Blood per rectum   . Anal fissure   . Abnormal uterine bleeding (AUB) 10/25/2015  . Endometrial polyp 10/25/2015  . Acanthosis nigricans 10/25/2015  . Allergic rhinitis 10/25/2015  . Moderate cervical dysplasia 10/25/2015  . Prediabetes 10/25/2015  . Dyspareunia, female 10/25/2015  . Dysmenorrhea 10/25/2015  . Increased BMI 10/25/2015     1. Monilial vulvovaginitis   2. Birth control counseling     Patient has monilia vaginitis despite taking 1 Diflucan.  This is possibly because she continues to take Cipro daily.  She has 1 day left of Cipro for UTI.  Patient is considering IUD use because she is not happy with her NuvaRing.   Plan:            1.  Discussed multiple options for treating monilia.  Patient has chosen Terazol 7.  2.  Discussed UTIs and use of Azo in combination with antibiotics for discomfort.   3.  IUD Literature on Mirena given.  Risks and benefits discussed.  She is considering IUD as an  option for birth/cycle control.  Orders No orders of the defined types were placed in this encounter.    Meds ordered this encounter  Medications  . terconazole (TERAZOL 7) 0.4 % vaginal cream    Sig: Place 1 applicator vaginally at bedtime for 7 days.    Dispense:  45 g    Refill:  0      F/U  No follow-ups on file. I spent 18 minutes involved in the care of this patient of which greater than 50% was spent discussing birth control options risks and benefits, urinary tract infection and additional medications that can be used if this should recur.  Vaginitis specifically monilia vaginitis and treatment options.  All questions answered.  Finis Bud, M.D. 02/11/2019 3:41 PM

## 2019-02-11 NOTE — Progress Notes (Signed)
Patient comes in today for vaginal itching external and internal. Patient states that vagina is swollen and red. She is on Cipro and diflucan Monday for UTI.

## 2019-04-08 ENCOUNTER — Telehealth: Payer: Self-pay

## 2019-04-08 NOTE — Telephone Encounter (Signed)
Patient states her refills form last OV were not sent to [facility]. She needs Nuvaring refill ASAP. Please call her if no refill can be sent or she will check later with [facility].

## 2019-04-09 ENCOUNTER — Other Ambulatory Visit: Payer: Self-pay | Admitting: Surgical

## 2019-04-09 MED ORDER — ETONOGESTREL-ETHINYL ESTRADIOL 0.12-0.015 MG/24HR VA RING: VAGINAL_RING | VAGINAL | 3 refills | Status: DC

## 2019-04-09 NOTE — Telephone Encounter (Signed)
This has already been done.

## 2019-05-18 ENCOUNTER — Other Ambulatory Visit: Payer: Self-pay | Admitting: Internal Medicine

## 2019-05-18 DIAGNOSIS — R200 Anesthesia of skin: Secondary | ICD-10-CM

## 2019-05-18 DIAGNOSIS — M5440 Lumbago with sciatica, unspecified side: Secondary | ICD-10-CM

## 2019-05-28 ENCOUNTER — Ambulatory Visit
Admission: RE | Admit: 2019-05-28 | Discharge: 2019-05-28 | Disposition: A | Discharge status: Home / Self Care | Payer: Medicaid Other | Source: Ambulatory Visit | Attending: Internal Medicine | Admitting: Internal Medicine

## 2019-05-28 ENCOUNTER — Other Ambulatory Visit: Payer: Self-pay

## 2019-05-28 DIAGNOSIS — R200 Anesthesia of skin: Secondary | ICD-10-CM | POA: Insufficient documentation

## 2019-05-28 DIAGNOSIS — M5440 Lumbago with sciatica, unspecified side: Secondary | ICD-10-CM | POA: Diagnosis present

## 2019-09-28 ENCOUNTER — Other Ambulatory Visit: Payer: Self-pay | Admitting: Acute Care

## 2019-09-28 DIAGNOSIS — G35 Multiple sclerosis: Secondary | ICD-10-CM

## 2019-10-08 ENCOUNTER — Ambulatory Visit: Payer: Medicaid Other

## 2019-10-23 ENCOUNTER — Ambulatory Visit: Payer: Medicaid Other

## 2019-11-05 ENCOUNTER — Other Ambulatory Visit: Payer: Self-pay

## 2019-11-05 ENCOUNTER — Ambulatory Visit
Admission: RE | Admit: 2019-11-05 | Discharge: 2019-11-05 | Disposition: A | Discharge status: Home / Self Care | Payer: Medicaid Other | Source: Ambulatory Visit | Attending: Acute Care | Admitting: Acute Care

## 2019-11-05 DIAGNOSIS — G35 Multiple sclerosis: Secondary | ICD-10-CM | POA: Insufficient documentation

## 2019-11-05 MED ORDER — GADOBUTROL 1 MMOL/ML IV SOLN
10.0000 mL | Freq: Once | INTRAVENOUS | Status: AC | PRN
  Administered 2019-11-05: 10 mL via INTRAVENOUS

## 2019-11-19 ENCOUNTER — Other Ambulatory Visit: Payer: Self-pay | Admitting: Acute Care

## 2019-11-19 DIAGNOSIS — G35 Multiple sclerosis: Secondary | ICD-10-CM

## 2019-11-20 ENCOUNTER — Other Ambulatory Visit: Payer: Self-pay | Admitting: Acute Care

## 2019-11-20 DIAGNOSIS — G35 Multiple sclerosis: Secondary | ICD-10-CM

## 2019-11-22 ENCOUNTER — Other Ambulatory Visit: Payer: Self-pay

## 2019-11-22 ENCOUNTER — Ambulatory Visit
Admission: RE | Admit: 2019-11-22 | Discharge: 2019-11-22 | Disposition: A | Discharge status: Home / Self Care | Payer: Medicaid Other | Source: Ambulatory Visit | Attending: Acute Care | Admitting: Acute Care

## 2019-11-22 DIAGNOSIS — G35 Multiple sclerosis: Secondary | ICD-10-CM | POA: Insufficient documentation

## 2019-11-22 MED ORDER — GADOBUTROL 1 MMOL/ML IV SOLN
10.0000 mL | Freq: Once | INTRAVENOUS | Status: AC | PRN
  Administered 2019-11-22: 11:00:00 10 mL via INTRAVENOUS

## 2019-11-25 ENCOUNTER — Ambulatory Visit
Admission: RE | Admit: 2019-11-25 | Discharge: 2019-11-25 | Disposition: A | Discharge status: Home / Self Care | Payer: Medicaid Other | Source: Ambulatory Visit | Attending: Acute Care | Admitting: Acute Care

## 2019-11-25 ENCOUNTER — Other Ambulatory Visit: Payer: Self-pay | Admitting: Acute Care

## 2019-11-25 ENCOUNTER — Other Ambulatory Visit: Payer: Self-pay

## 2019-11-25 DIAGNOSIS — G35 Multiple sclerosis: Secondary | ICD-10-CM

## 2019-11-25 LAB — CSF CELL COUNT WITH DIFFERENTIAL
Lymphs, CSF: 98 %
Monocyte-Macrophage-Spinal Fluid: 1 %
RBC Count, CSF: 385 /mm3 — ABNORMAL HIGH (ref 0–3)
Segmented Neutrophils-CSF: 1 %
Tube #: 3
WBC, CSF: 18 /mm3 (ref 0–5)

## 2019-11-25 LAB — CBC WITH DIFFERENTIAL/PLATELET
Abs Immature Granulocytes: 0.02 10*3/uL (ref 0.00–0.07)
Basophils Absolute: 0.1 10*3/uL (ref 0.0–0.1)
Basophils Relative: 1 %
Eosinophils Absolute: 0.1 10*3/uL (ref 0.0–0.5)
Eosinophils Relative: 1 %
HCT: 40.1 % (ref 36.0–46.0)
Hemoglobin: 12.9 g/dL (ref 12.0–15.0)
Immature Granulocytes: 0 %
Lymphocytes Relative: 37 %
Lymphs Abs: 3 10*3/uL (ref 0.7–4.0)
MCH: 27.2 pg (ref 26.0–34.0)
MCHC: 32.2 g/dL (ref 30.0–36.0)
MCV: 84.4 fL (ref 80.0–100.0)
Monocytes Absolute: 0.6 10*3/uL (ref 0.1–1.0)
Monocytes Relative: 8 %
Neutro Abs: 4.3 10*3/uL (ref 1.7–7.7)
Neutrophils Relative %: 53 %
Platelets: 297 10*3/uL (ref 150–400)
RBC: 4.75 MIL/uL (ref 3.87–5.11)
RDW: 12 % (ref 11.5–15.5)
WBC: 8.1 10*3/uL (ref 4.0–10.5)
nRBC: 0 % (ref 0.0–0.2)

## 2019-11-25 LAB — POCT URINE PREGNANCY: Preg Test, Ur: NEGATIVE

## 2019-11-25 LAB — ALBUMIN: Albumin: 3.6 g/dL (ref 3.5–5.0)

## 2019-11-25 LAB — GLUCOSE, CSF: Glucose, CSF: 53 mg/dL (ref 40–70)

## 2019-11-25 LAB — PROTIME-INR
INR: 1 (ref 0.8–1.2)
Prothrombin Time: 13 seconds (ref 11.4–15.2)

## 2019-11-25 LAB — ALBUMIN, PLEURAL OR PERITONEAL FLUID: Albumin, Fluid: <1 g/dL

## 2019-11-25 LAB — APTT: aPTT: 29 seconds (ref 24–36)

## 2019-11-25 LAB — PROTEIN, CSF: Total  Protein, CSF: 21 mg/dL (ref 15–45)

## 2019-11-25 LAB — POCT PREGNANCY, URINE: Preg Test, Ur: NEGATIVE

## 2019-11-25 MED ORDER — LIDOCAINE HCL (PF) 1 % IJ SOLN
5.0000 mL | Freq: Once | INTRAMUSCULAR | Status: AC
  Administered 2019-11-25: 5 mL
  Filled 2019-11-25: qty 5

## 2019-11-25 MED ORDER — ACETAMINOPHEN 500 MG PO TABS
1000.0000 mg | ORAL_TABLET | Freq: Once | ORAL | Status: AC
  Administered 2019-11-25: 1000 mg via ORAL

## 2019-11-25 NOTE — OR Nursing (Signed)
Patient is being transported to radiology room 4 for her lumbar puncture.

## 2019-11-25 NOTE — Progress Notes (Signed)
Halliday, NP called with CSF lab value of WBC of 18. Pt continues to lay flat after LP tolerated lunch well denies NV.

## 2019-11-25 NOTE — Discharge Instructions (Signed)
Lumbar Puncture, Care After This sheet gives you information about how to care for yourself after your procedure. Your health care provider may also give you more specific instructions. If you have problems or questions, contact your health care provider. What can I expect after the procedure? After the procedure, it is common to have:  Mild discomfort or pain at the puncture site.  A mild headache that is relieved with pain medicines. Follow these instructions at home: Activity   Lie down flat or rest for as long as directed by your health care provider.  Return to your normal activities as told by your health care provider. Ask your health care provider what activities are safe for you.  Avoid lifting anything heavier than 10 lb (4.5 kg) for at least 12 hours after the procedure.  Do not drive for 24 hours if you were given a medicine to help you relax (sedative) during your procedure.  Do not drive or use heavy machinery while taking prescription pain medicine. Puncture site care  Remove or change your bandage (dressing) as told by your health care provider.  Check your puncture area every day for signs of infection. Check for: ? More pain. ? Redness or swelling. ? Fluid or blood leaking from the puncture site. ? Warmth. ? Pus or a bad smell. General instructions  Take over-the-counter and prescription medicines only as told by your health care provider.  Drink enough fluids to keep your urine clear or pale yellow. Your health care provider may recommend drinking caffeine to prevent a headache.  Keep all follow-up visits as told by your health care provider. This is important. Contact a health care provider if:  You have fever or chills.  You have nausea or vomiting.  You have a headache that lasts for more than 2 days or does not get better with medicine. Get help right away if:  You develop any of the following in your  legs: ? Weakness. ? Numbness. ? Tingling.  You are unable to control when you urinate or have a bowel movement (incontinence).  You have signs of infection around your puncture site, such as: ? More pain. ? Redness or swelling. ? Fluid or blood leakage. ? Warmth. ? Pus or a bad smell.  You are dizzy or you feel like you might faint.  You have a severe headache, especially when you sit or stand. Summary  A lumbar puncture is a procedure in which a small needle is inserted into the lower back to remove fluid that surrounds the brain and spinal cord.  After this procedure, it is common to have a headache and pain around the needle insertion area.  Lying flat, staying hydrated, and drinking caffeine can help prevent headaches.  Monitor your needle insertion site for signs of infection, including warmth, fluid, or more pain.  Get help right away if you develop leg weakness, leg numbness, incontinence, or severe headaches. This information is not intended to replace advice given to you by your health care provider. Make sure you discuss any questions you have with your health care provider. Document Revised: 09/26/2016 Document Reviewed: 09/26/2016 Elsevier Patient Education  2020 [employer]  

## 2019-11-25 NOTE — Progress Notes (Signed)
Patient reports a headache rated 6.5. Dr patel notified and orders received. tylenol 1000mg  given. Also notified of CSF WBC count of 18.

## 2019-11-26 LAB — PATHOLOGIST SMEAR REVIEW

## 2019-11-26 LAB — IGG CSF INDEX
Albumin CSF-mCnc: 13 mg/dL (ref 11–48)
Albumin: 4.1 g/dL (ref 3.8–4.8)
CSF IgG Index: 0.6 (ref 0.0–0.7)
IgG (Immunoglobin G), Serum: 1386 mg/dL (ref 586–1602)
IgG, CSF: 2.8 mg/dL (ref 0.0–8.6)
IgG/Alb Ratio, CSF: 0.22 (ref 0.00–0.25)

## 2019-11-27 LAB — OLIGOCLONAL BANDS, CSF + SERM

## 2019-11-28 LAB — CSF CULTURE W GRAM STAIN
Culture: NO GROWTH
Gram Stain: NONE SEEN

## 2020-02-09 ENCOUNTER — Other Ambulatory Visit: Payer: Self-pay

## 2020-02-09 ENCOUNTER — Ambulatory Visit (HOSPITAL_COMMUNITY)
Admission: RE | Admit: 2020-02-09 | Discharge: 2020-02-09 | Disposition: A | Discharge status: Home / Self Care | Payer: Medicaid Other | Source: Ambulatory Visit | Attending: Internal Medicine | Admitting: Internal Medicine

## 2020-02-09 DIAGNOSIS — G35 Multiple sclerosis: Secondary | ICD-10-CM | POA: Insufficient documentation

## 2020-02-09 MED ORDER — SODIUM CHLORIDE 0.9 % IV SOLN
300.0000 mg | INTRAVENOUS | Status: DC
  Administered 2020-02-09: 300 mg via INTRAVENOUS
  Filled 2020-02-09: qty 15

## 2020-02-09 MED ORDER — SODIUM CHLORIDE 0.9 % IV SOLN
INTRAVENOUS | Status: DC | PRN
  Administered 2020-02-09: 250 mL via INTRAVENOUS

## 2020-02-09 NOTE — Progress Notes (Signed)
Patient received IV Tysabri as ordered by Gurney Maxin MD Observed for at least 60 minutes post infusion.Tolerated well, vitals stable, discharge instructions given, verbalized understanding. Patient alert, oriented and ambulatory at the time of discharge.

## 2020-02-09 NOTE — Discharge Instructions (Signed)
Natalizumab injection What is this medicine? NATALIZUMAB (na ta LIZ you mab) is used to treat relapsing multiple sclerosis. This drug is not a cure. It is also used to treat Crohn's disease. This medicine may be used for other purposes; ask your health care provider or pharmacist if you have questions. COMMON BRAND NAME(S): Tysabri What should I tell my health care provider before I take this medicine? They need to know if you have any of these conditions:  immune system problems  progressive multifocal leukoencephalopathy (PML)  an unusual or allergic reaction to natalizumab, other medicines, foods, dyes, or preservatives  pregnant or trying to get pregnant  breast-feeding How should I use this medicine? This medicine is for infusion into a vein. It is given by a health care professional in a hospital or clinic setting. A special MedGuide will be given to you by the pharmacist with each prescription and refill. Be sure to read this information carefully each time. Talk to your pediatrician regarding the use of this medicine in children. This medicine is not approved for use in children. Overdosage: If you think you have taken too much of this medicine contact a poison control center or emergency room at once. NOTE: This medicine is only for you. Do not share this medicine with others. What if I miss a dose? It is important not to miss your dose. Call your doctor or health care professional if you are unable to keep an appointment. What may interact with this medicine? Do not take this medicine with any of the following medications:  biologic medicines such as adalimumab, certolizumab, etanercept, golimumab, infliximab This medicine may also interact with the following medications:  azathioprine  cyclosporine  interferons  6-mercaptopurine  methotrexate  other medicines that lower your chance of fighting an infection  steroid medicines like prednisone or  cortisone  vaccines This list may not describe all possible interactions. Give your health care provider a list of all the medicines, herbs, non-prescription drugs, or dietary supplements you use. Also tell them if you smoke, drink alcohol, or use illegal drugs. Some items may interact with your medicine. What should I watch for while using this medicine? Your condition will be monitored carefully while you are receiving this medicine. Visit your doctor for regular check ups. Tell your doctor or healthcare professional if your symptoms do not start to get better or if they get worse. Stay away from people who are sick. Call your doctor or health care professional for advice if you get a fever, chills or sore throat, or other symptoms of a cold or flu. Do not treat yourself. In some patients, this medicine may cause a serious brain infection that may cause death. If you have any problems seeing, thinking, speaking, walking, or standing, tell your doctor right away. If you cannot reach your doctor, get urgent medical care. What side effects may I notice from receiving this medicine? Side effects that you should report to your doctor or health care professional as soon as possible:  allergic reactions like skin rash, itching or hives, swelling of the face, lips, or tongue  breathing problems  changes in vision  chest pain  confusion  depressed mood  dizziness  feeling faint; lightheaded; falls  general ill feeling or flu-like symptoms  loss of memory  missed menstrual periods  muscle weakness  problems with balance, talking, or walking  signs and symptoms of liver injury like dark yellow or brown urine; general ill feeling or flu-like symptoms; light-colored  stools; loss of appetite; nausea; right upper belly pain; unusually weak or tired; yellowing of the eyes or skin  suicidal thoughts, mood changes  unusual bruising or bleeding  unusually weak or tired Side effects that  usually do not require medical attention (report to your doctor or health care professional if they continue or are bothersome):  headache  joint pain  muscle cramps  muscle pain  nausea, vomiting  pain, redness, or irritation at site where injected  tiredness This list may not describe all possible side effects. Call your doctor for medical advice about side effects. You may report side effects to FDA at 1-800-FDA-1088. Where should I keep my medicine? This drug is given in a hospital or clinic and will not be stored at home. NOTE: This sheet is a summary. It may not cover all possible information. If you have questions about this medicine, talk to your doctor, pharmacist, or health care provider.  2020 Elsevier/[employer] Standard (2019-02-16 13:20:26)  

## 2020-03-22 ENCOUNTER — Ambulatory Visit (HOSPITAL_COMMUNITY)
Admission: RE | Admit: 2020-03-22 | Discharge: 2020-03-22 | Disposition: A | Discharge status: Home / Self Care | Payer: Medicaid Other | Source: Ambulatory Visit | Attending: Internal Medicine | Admitting: Internal Medicine

## 2020-03-22 ENCOUNTER — Other Ambulatory Visit: Payer: Self-pay

## 2020-03-22 DIAGNOSIS — G35 Multiple sclerosis: Secondary | ICD-10-CM | POA: Diagnosis present

## 2020-03-22 MED ORDER — SODIUM CHLORIDE 0.9 % IV SOLN
300.0000 mg | INTRAVENOUS | Status: DC
  Administered 2020-03-22: 300 mg via INTRAVENOUS
  Filled 2020-03-22: qty 15

## 2020-03-22 MED ORDER — SODIUM CHLORIDE 0.9 % IV SOLN
INTRAVENOUS | Status: DC | PRN
  Administered 2020-03-22: 250 mL via INTRAVENOUS

## 2020-03-22 NOTE — Progress Notes (Signed)
Patient received IV Tysabri as ordered by Gurney Maxin MD. Observed for at least 60 minutes post infusion.Tolerated well, vitals stable, discharge instructions given, verbalized understanding. Patient alert, oriented and ambulatory at the time of discharge.

## 2020-03-22 NOTE — Discharge Instructions (Signed)
Natalizumab injection What is this medicine? NATALIZUMAB (na ta LIZ you mab) is used to treat relapsing multiple sclerosis. This drug is not a cure. It is also used to treat Crohn's disease. This medicine may be used for other purposes; ask your health care provider or pharmacist if you have questions. COMMON BRAND NAME(S): Tysabri What should I tell my health care provider before I take this medicine? They need to know if you have any of these conditions:  immune system problems  progressive multifocal leukoencephalopathy (PML)  an unusual or allergic reaction to natalizumab, other medicines, foods, dyes, or preservatives  pregnant or trying to get pregnant  breast-feeding How should I use this medicine? This medicine is for infusion into a vein. It is given by a health care professional in a hospital or clinic setting. A special MedGuide will be given to you by the pharmacist with each prescription and refill. Be sure to read this information carefully each time. Talk to your pediatrician regarding the use of this medicine in children. This medicine is not approved for use in children. Overdosage: If you think you have taken too much of this medicine contact a poison control center or emergency room at once. NOTE: This medicine is only for you. Do not share this medicine with others. What if I miss a dose? It is important not to miss your dose. Call your doctor or health care professional if you are unable to keep an appointment. What may interact with this medicine? Do not take this medicine with any of the following medications:  biologic medicines such as adalimumab, certolizumab, etanercept, golimumab, infliximab This medicine may also interact with the following medications:  azathioprine  cyclosporine  interferons  6-mercaptopurine  methotrexate  other medicines that lower your chance of fighting an infection  steroid medicines like prednisone or  cortisone  vaccines This list may not describe all possible interactions. Give your health care provider a list of all the medicines, herbs, non-prescription drugs, or dietary supplements you use. Also tell them if you smoke, drink alcohol, or use illegal drugs. Some items may interact with your medicine. What should I watch for while using this medicine? Your condition will be monitored carefully while you are receiving this medicine. Visit your doctor for regular check ups. Tell your doctor or healthcare professional if your symptoms do not start to get better or if they get worse. Stay away from people who are sick. Call your doctor or health care professional for advice if you get a fever, chills or sore throat, or other symptoms of a cold or flu. Do not treat yourself. In some patients, this medicine may cause a serious brain infection that may cause death. If you have any problems seeing, thinking, speaking, walking, or standing, tell your doctor right away. If you cannot reach your doctor, get urgent medical care. What side effects may I notice from receiving this medicine? Side effects that you should report to your doctor or health care professional as soon as possible:  allergic reactions like skin rash, itching or hives, swelling of the face, lips, or tongue  breathing problems  changes in vision  chest pain  confusion  depressed mood  dizziness  feeling faint; lightheaded; falls  general ill feeling or flu-like symptoms  loss of memory  missed menstrual periods  muscle weakness  problems with balance, talking, or walking  signs and symptoms of liver injury like dark yellow or brown urine; general ill feeling or flu-like symptoms; light-colored  stools; loss of appetite; nausea; right upper belly pain; unusually weak or tired; yellowing of the eyes or skin  suicidal thoughts, mood changes  unusual bruising or bleeding  unusually weak or tired Side effects that  usually do not require medical attention (report to your doctor or health care professional if they continue or are bothersome):  headache  joint pain  muscle cramps  muscle pain  nausea, vomiting  pain, redness, or irritation at site where injected  tiredness This list may not describe all possible side effects. Call your doctor for medical advice about side effects. You may report side effects to FDA at 1-800-FDA-1088. Where should I keep my medicine? This drug is given in a hospital or clinic and will not be stored at home. NOTE: This sheet is a summary. It may not cover all possible information. If you have questions about this medicine, talk to your doctor, pharmacist, or health care provider.  2020 Elsevier/[employer] Standard (2019-02-16 13:20:26)  

## 2020-05-03 ENCOUNTER — Ambulatory Visit (HOSPITAL_COMMUNITY)
Admission: RE | Admit: 2020-05-03 | Discharge: 2020-05-03 | Disposition: A | Discharge status: Home / Self Care | Payer: Medicaid Other | Source: Ambulatory Visit | Attending: Internal Medicine | Admitting: Internal Medicine

## 2020-05-03 ENCOUNTER — Other Ambulatory Visit: Payer: Self-pay

## 2020-05-03 DIAGNOSIS — G35 Multiple sclerosis: Secondary | ICD-10-CM | POA: Diagnosis present

## 2020-05-03 MED ORDER — SODIUM CHLORIDE 0.9 % IV SOLN
300.0000 mg | INTRAVENOUS | Status: DC
  Administered 2020-05-03: 300 mg via INTRAVENOUS
  Filled 2020-05-03: qty 15

## 2020-05-03 MED ORDER — SODIUM CHLORIDE 0.9 % IV SOLN
INTRAVENOUS | Status: DC | PRN
  Administered 2020-05-03: 250 mL via INTRAVENOUS

## 2020-05-03 NOTE — Progress Notes (Signed)
PATIENT [facility]ARE C[facility] NOTE  Diagnosis:G35   Provider:Potter, Zachary, MD   Procedure:Tysabri infusion   Note:Patient received Tysabri infusion via PIV. Patient tolerated infusion well with no adverse reaction. Vital signs remained stable. Patient declined to wait for 1 hour post-infusion. Discharge instructions given. Patient to come back every 6 weeks for infusion. Alert, oriented and ambulatory at discharge. 

## 2020-05-03 NOTE — Discharge Instructions (Signed)
Natalizumab injection What is this medicine? NATALIZUMAB (na ta LIZ you mab) is used to treat relapsing multiple sclerosis. This drug is not a cure. It is also used to treat Crohn's disease. This medicine may be used for other purposes; ask your health care provider or pharmacist if you have questions. COMMON BRAND NAME(S): Tysabri What should I tell my health care provider before I take this medicine? They need to know if you have any of these conditions:  immune system problems  progressive multifocal leukoencephalopathy (PML)  an unusual or allergic reaction to natalizumab, other medicines, foods, dyes, or preservatives  pregnant or trying to get pregnant  breast-feeding How should I use this medicine? This medicine is for infusion into a vein. It is given by a health care professional in a hospital or clinic setting. A special MedGuide will be given to you by the pharmacist with each prescription and refill. Be sure to read this information carefully each time. Talk to your pediatrician regarding the use of this medicine in children. This medicine is not approved for use in children. Overdosage: If you think you have taken too much of this medicine contact a poison control center or emergency room at once. NOTE: This medicine is only for you. Do not share this medicine with others. What if I miss a dose? It is important not to miss your dose. Call your doctor or health care professional if you are unable to keep an appointment. What may interact with this medicine? Do not take this medicine with any of the following medications:  biologic medicines such as adalimumab, certolizumab, etanercept, golimumab, infliximab This medicine may also interact with the following medications:  azathioprine  cyclosporine  interferons  6-mercaptopurine  methotrexate  other medicines that lower your chance of fighting an infection  steroid medicines like prednisone or  cortisone  vaccines This list may not describe all possible interactions. Give your health care provider a list of all the medicines, herbs, non-prescription drugs, or dietary supplements you use. Also tell them if you smoke, drink alcohol, or use illegal drugs. Some items may interact with your medicine. What should I watch for while using this medicine? Your condition will be monitored carefully while you are receiving this medicine. Visit your doctor for regular check ups. Tell your doctor or healthcare professional if your symptoms do not start to get better or if they get worse. Stay away from people who are sick. Call your doctor or health care professional for advice if you get a fever, chills or sore throat, or other symptoms of a cold or flu. Do not treat yourself. In some patients, this medicine may cause a serious brain infection that may cause death. If you have any problems seeing, thinking, speaking, walking, or standing, tell your doctor right away. If you cannot reach your doctor, get urgent medical care. What side effects may I notice from receiving this medicine? Side effects that you should report to your doctor or health care professional as soon as possible:  allergic reactions like skin rash, itching or hives, swelling of the face, lips, or tongue  breathing problems  changes in vision  chest pain  confusion  depressed mood  dizziness  feeling faint; lightheaded; falls  general ill feeling or flu-like symptoms  loss of memory  missed menstrual periods  muscle weakness  problems with balance, talking, or walking  signs and symptoms of liver injury like dark yellow or brown urine; general ill feeling or flu-like symptoms; light-colored  stools; loss of appetite; nausea; right upper belly pain; unusually weak or tired; yellowing of the eyes or skin  suicidal thoughts, mood changes  unusual bruising or bleeding  unusually weak or tired Side effects that  usually do not require medical attention (report to your doctor or health care professional if they continue or are bothersome):  headache  joint pain  muscle cramps  muscle pain  nausea, vomiting  pain, redness, or irritation at site where injected  tiredness This list may not describe all possible side effects. Call your doctor for medical advice about side effects. You may report side effects to FDA at 1-800-FDA-1088. Where should I keep my medicine? This drug is given in a hospital or clinic and will not be stored at home. NOTE: This sheet is a summary. It may not cover all possible information. If you have questions about this medicine, talk to your doctor, pharmacist, or health care provider.  2020 Elsevier/[employer] Standard (2019-02-16 13:20:26)  

## 2020-06-14 ENCOUNTER — Other Ambulatory Visit: Payer: Self-pay

## 2020-06-14 ENCOUNTER — Ambulatory Visit (HOSPITAL_COMMUNITY)
Admission: RE | Admit: 2020-06-14 | Discharge: 2020-06-14 | Disposition: A | Discharge status: Home / Self Care | Payer: Medicaid Other | Source: Ambulatory Visit | Attending: Internal Medicine | Admitting: Internal Medicine

## 2020-06-14 DIAGNOSIS — G35 Multiple sclerosis: Secondary | ICD-10-CM | POA: Diagnosis present

## 2020-06-14 MED ORDER — SODIUM CHLORIDE 0.9 % IV SOLN
300.0000 mg | INTRAVENOUS | Status: DC
  Administered 2020-06-14: 300 mg via INTRAVENOUS
  Filled 2020-06-14: qty 15

## 2020-06-14 MED ORDER — SODIUM CHLORIDE 0.9 % IV SOLN
INTRAVENOUS | Status: DC | PRN
  Administered 2020-06-14: 250 mL via INTRAVENOUS

## 2020-06-14 NOTE — Discharge Instructions (Signed)
Natalizumab injection What is this medicine? NATALIZUMAB (na ta LIZ you mab) is used to treat relapsing multiple sclerosis. This drug is not a cure. It is also used to treat Crohn's disease. This medicine may be used for other purposes; ask your health care provider or pharmacist if you have questions. COMMON BRAND NAME(S): Tysabri What should I tell my health care provider before I take this medicine? They need to know if you have any of these conditions:  immune system problems  progressive multifocal leukoencephalopathy (PML)  an unusual or allergic reaction to natalizumab, other medicines, foods, dyes, or preservatives  pregnant or trying to get pregnant  breast-feeding How should I use this medicine? This medicine is for infusion into a vein. It is given by a health care professional in a hospital or clinic setting. A special MedGuide will be given to you by the pharmacist with each prescription and refill. Be sure to read this information carefully each time. Talk to your pediatrician regarding the use of this medicine in children. This medicine is not approved for use in children. Overdosage: If you think you have taken too much of this medicine contact a poison control center or emergency room at once. NOTE: This medicine is only for you. Do not share this medicine with others. What if I miss a dose? It is important not to miss your dose. Call your doctor or health care professional if you are unable to keep an appointment. What may interact with this medicine? Do not take this medicine with any of the following medications:  biologic medicines such as adalimumab, certolizumab, etanercept, golimumab, infliximab This medicine may also interact with the following medications:  azathioprine  cyclosporine  interferons  6-mercaptopurine  methotrexate  other medicines that lower your chance of fighting an infection  steroid medicines like prednisone or  cortisone  vaccines This list may not describe all possible interactions. Give your health care provider a list of all the medicines, herbs, non-prescription drugs, or dietary supplements you use. Also tell them if you smoke, drink alcohol, or use illegal drugs. Some items may interact with your medicine. What should I watch for while using this medicine? Your condition will be monitored carefully while you are receiving this medicine. Visit your doctor for regular check ups. Tell your doctor or healthcare professional if your symptoms do not start to get better or if they get worse. Stay away from people who are sick. Call your doctor or health care professional for advice if you get a fever, chills or sore throat, or other symptoms of a cold or flu. Do not treat yourself. In some patients, this medicine may cause a serious brain infection that may cause death. If you have any problems seeing, thinking, speaking, walking, or standing, tell your doctor right away. If you cannot reach your doctor, get urgent medical care. What side effects may I notice from receiving this medicine? Side effects that you should report to your doctor or health care professional as soon as possible:  allergic reactions like skin rash, itching or hives, swelling of the face, lips, or tongue  breathing problems  changes in vision  chest pain  confusion  depressed mood  dizziness  feeling faint; lightheaded; falls  general ill feeling or flu-like symptoms  loss of memory  missed menstrual periods  muscle weakness  problems with balance, talking, or walking  signs and symptoms of liver injury like dark yellow or brown urine; general ill feeling or flu-like symptoms; light-colored  stools; loss of appetite; nausea; right upper belly pain; unusually weak or tired; yellowing of the eyes or skin  suicidal thoughts, mood changes  unusual bruising or bleeding  unusually weak or tired Side effects that  usually do not require medical attention (report to your doctor or health care professional if they continue or are bothersome):  headache  joint pain  muscle cramps  muscle pain  nausea, vomiting  pain, redness, or irritation at site where injected  tiredness This list may not describe all possible side effects. Call your doctor for medical advice about side effects. You may report side effects to FDA at 1-800-FDA-1088. Where should I keep my medicine? This drug is given in a hospital or clinic and will not be stored at home. NOTE: This sheet is a summary. It may not cover all possible information. If you have questions about this medicine, talk to your doctor, pharmacist, or health care provider.  2020 [employer]vier/Gold Standard (2019-02-16 13:20:26)

## 2020-06-14 NOTE — Progress Notes (Signed)
PATIENT [facility]ARE C[facility] NOTE  Diagnosis:G35   Provider:Potter, Zachary, MD   Procedure:Tysabri infusion   Note:Patient received Tysabri infusion via PIV. Patient tolerated infusion well with no adverse reaction. Vital signs remained stable. Patient declined to wait for 1 hour post-infusion. Discharge instructions given. Patient to come back every 6 weeks for infusion. Alert, oriented and ambulatory at discharge. 

## 2020-07-04 ENCOUNTER — Emergency Department
Admission: EM | Admit: 2020-07-04 | Discharge: 2020-07-04 | Disposition: A | Discharge status: Home / Self Care | Payer: Medicaid Other | Attending: Emergency Medicine | Admitting: Emergency Medicine

## 2020-07-04 ENCOUNTER — Other Ambulatory Visit: Payer: Self-pay

## 2020-07-04 ENCOUNTER — Emergency Department: Payer: Medicaid Other

## 2020-07-04 DIAGNOSIS — R42 Dizziness and giddiness: Secondary | ICD-10-CM | POA: Insufficient documentation

## 2020-07-04 DIAGNOSIS — N939 Abnormal uterine and vaginal bleeding, unspecified: Secondary | ICD-10-CM | POA: Diagnosis not present

## 2020-07-04 DIAGNOSIS — D251 Intramural leiomyoma of uterus: Secondary | ICD-10-CM | POA: Diagnosis not present

## 2020-07-04 DIAGNOSIS — D250 Submucous leiomyoma of uterus: Secondary | ICD-10-CM

## 2020-07-04 LAB — CBC
HCT: 38.2 % (ref 36.0–46.0)
Hemoglobin: 12.4 g/dL (ref 12.0–15.0)
MCH: 27.3 pg (ref 26.0–34.0)
MCHC: 32.5 g/dL (ref 30.0–36.0)
MCV: 84 fL (ref 80.0–100.0)
Platelets: 333 10*3/uL (ref 150–400)
RBC: 4.55 MIL/uL (ref 3.87–5.11)
RDW: 12.8 % (ref 11.5–15.5)
WBC: 12.1 10*3/uL — ABNORMAL HIGH (ref 4.0–10.5)
nRBC: 0.6 % — ABNORMAL HIGH (ref 0.0–0.2)

## 2020-07-04 LAB — BASIC METABOLIC PANEL
Anion gap: 10 (ref 5–15)
BUN: 12 mg/dL (ref 6–20)
CO2: 24 mmol/L (ref 22–32)
Calcium: 9.2 mg/dL (ref 8.9–10.3)
Chloride: 102 mmol/L (ref 98–111)
Creatinine, Ser: 0.79 mg/dL (ref 0.44–1.00)
GFR, Estimated: >60 mL/min (ref >60)
Glucose, Bld: 99 mg/dL (ref 70–99)
Potassium: 3.9 mmol/L (ref 3.5–5.1)
Sodium: 136 mmol/L (ref 135–145)

## 2020-07-04 LAB — TYPE AND SCREEN
ABO/RH(D): O POS
Antibody Screen: NEGATIVE

## 2020-07-04 LAB — POC URINE PREG, ED: Preg Test, Ur: NEGATIVE

## 2020-07-04 MED ORDER — MEDROXYPROGESTERONE ACETATE 10 MG PO TABS: 20.0000 mg | ORAL_TABLET | Freq: Three times a day (TID) | ORAL | 0 refills | Status: DC

## 2020-07-04 MED ORDER — OXYCODONE-ACETAMINOPHEN 5-325 MG PO TABS
1.0000 | ORAL_TABLET | Freq: Once | ORAL | Status: AC
  Administered 2020-07-04: 1 via ORAL
  Filled 2020-07-04: qty 1

## 2020-07-04 MED ORDER — KETOROLAC TROMETHAMINE 10 MG PO TABS
10.0000 mg | ORAL_TABLET | Freq: Once | ORAL | Status: AC
  Administered 2020-07-04: 10 mg via ORAL
  Filled 2020-07-04: qty 1

## 2020-07-04 NOTE — ED Provider Notes (Signed)
[facility] Emergency Department Provider Note  ____________________________________________  Time seen: Approximately 2:01 PM  I have reviewed the triage vital signs and the nursing notes.   HISTORY  Chief Complaint Vaginal Bleeding    HPI Meagan Powers is a 34 y.o. female with a history of cervical dysplasia status post LEEP procedure, obesity, migraines who comes the ED complaining of vaginal bleeding.  She had previously been on NuvaRing, but discontinued that and had a period on November 2.  However, since then she has had continuous heavy bleeding daily, at times going through a pad an hour, passing large blood clots.  Also reports some intermittent lightheadedness with standing.  No chest pain shortness of breath or fever.  No significant abdominal pain, no dysuria.  No vaginal discharge.      Past Medical History:  Diagnosis Date  . Abnormal uterine bleeding (AUB)   . Acanthosis nigricans   . AR (allergic rhinitis)   . Cervical dysplasia   . Dysmenorrhea   . Dyspareunia in female   . Incompetent cervix   . Increased BMI   . Migraine   . Migraine   . Pre-diabetes      Patient Active Problem List   Diagnosis Date Noted  . Blood per rectum   . Anal fissure   . Abnormal uterine bleeding (AUB) 10/25/2015  . Endometrial polyp 10/25/2015  . Acanthosis nigricans 10/25/2015  . Allergic rhinitis 10/25/2015  . Moderate cervical dysplasia 10/25/2015  . Prediabetes 10/25/2015  . Dyspareunia, female 10/25/2015  . Dysmenorrhea 10/25/2015  . Increased BMI 10/25/2015     Past Surgical History:  Procedure Laterality Date  . CERVICAL BIOPSY  W/ LOOP ELECTRODE EXCISION    . CERVICAL CERCLAGE     x 2  . CHOLECYSTECTOMY    . COLONOSCOPY WITH PROPOFOL N/A 10/15/2018   Procedure: COLONOSCOPY WITH PROPOFOL;  Surgeon: Virgel Manifold, MD;  Location: [facility] ENDOSCOPY;  Service: Endoscopy;  Laterality: N/A;  . DILATION AND CURETTAGE OF UTERUS    .  HYSTEROSCOPY WITH D & C    . LAPAROSCOPY       Prior to Admission medications   Medication Sig Start Date End Date Taking? Authorizing Provider  EPINEPHrine 0.3 mg/0.3 mL IJ SOAJ injection Inject 0.3 mg into the muscle. 03/12/16   [provider]  etonogestrel-ethinyl estradiol (NUVARING) 0.12-0.015 MG/24HR vaginal ring Insert NuvaRing intravaginal every 21 days x 4 rings, then remove last ring for 7 days before restarting cycle 04/09/19   Harlin Heys, MD  fluticasone [facility]) 50 MCG/ACT nasal spray Place 2 sprays into both nostrils daily.    [provider]  ibuprofen (ADVIL,MOTRIN) 800 MG tablet Take 800 mg by mouth every 8 (eight) hours as needed.    [provider]  medroxyPROGESTERone (PROVERA) 10 MG tablet Take 2 tablets (20 mg total) by mouth in the morning, at noon, and at bedtime for 7 days. 07/04/20 07/11/20  Carrie Mew, MD  SUMAtriptan (IMITREX) 50 MG tablet Take 50 mg by mouth every 2 (two) hours as needed for migraine. May repeat in 2 hours if headache persists or recurs.    [provider]     Allergies Shellfish allergy and Iodine   Family History  Problem Relation Age of Onset  . Hypertension Mother   . Cancer Neg Hx   . Diabetes Neg Hx   . Heart disease Neg Hx     Social History Social History   Tobacco Use  .  Smoking status: Never Smoker  . Smokeless tobacco: Never Used  Vaping Use  . Vaping Use: Never used  Substance Use Topics  . Alcohol use: Yes    Comment: occas  . Drug use: No    Review of Systems  Constitutional:   No fever or chills.  ENT:   No sore throat. No rhinorrhea. Cardiovascular:   No chest pain or syncope. Respiratory:   No dyspnea or cough. Gastrointestinal:   Negative for abdominal pain, vomiting and diarrhea.  Musculoskeletal:   Negative for focal pain or swelling All other systems reviewed and are negative except as documented above in ROS and  HPI.  ____________________________________________   PHYSICAL EXAM:  VITAL SIGNS: ED Triage Vitals  Enc Vitals Group     BP 07/04/20 0914 112/75     Pulse Rate 07/04/20 0914 (!) 106     Resp 07/04/20 0914 17     Temp 07/04/20 1122 98.3 F (36.8 C)     Temp Source 07/04/20 0914 Oral     SpO2 07/04/20 0914 98 %     Weight 07/04/20 0914 226 lb (102.5 kg)     Height 07/04/20 0914 5\' 2"  (1.575 m)     Head Circumference --      Peak Flow --      Pain Score 07/04/20 0914 6     Pain Loc --      Pain Edu? --      Excl. in [place]? --     Vital signs reviewed, nursing assessments reviewed.   Constitutional:   Alert and oriented. Non-toxic appearance. Eyes:   Conjunctivae are normal. EOMI. PERRL. ENT      Head:   Normocephalic and atraumatic.      Nose:   Wearing a mask.      Mouth/Throat:   Wearing a mask.      Neck:   No meningismus. Full ROM. Hematological/Lymphatic/Immunilogical:   No cervical lymphadenopathy. Cardiovascular:   RRR. Symmetric bilateral radial and DP pulses.  No murmurs. Cap refill less than 2 seconds. Respiratory:   Normal respiratory effort without tachypnea/retractions. Breath sounds are clear and equal bilaterally. No wheezes/rales/rhonchi. Gastrointestinal:   Soft and nontender. Non distended. There is no CVA tenderness.  No rebound, rigidity, or guarding.  Genitourinary:   Performed with nurse Karena Addison at bedside.  External exam unremarkable.  With speculum exam, there is limitation of speculum entry into the vaginal vault, inability to open the speculum.  Unable to reliably visualize the cervix.  There is a small amount of blood in the vault as well.  Manual exam reveals firm soft tissue in the vagina with irregular contour.  Musculoskeletal:   Normal range of motion in all extremities. No joint effusions.  No lower extremity tenderness.  No edema. Neurologic:   Normal speech and language.  Motor grossly intact. No acute focal neurologic deficits are appreciated.   Skin:    Skin is warm, dry and intact. No rash noted.  No petechiae, purpura, or bullae.  ____________________________________________    LABS (pertinent positives/negatives) (all labs ordered are listed, but only abnormal results are displayed) Labs Reviewed  CBC - Abnormal; Notable for the following components:      Result Value   WBC 12.1 (*)    nRBC 0.6 (*)    All other components within normal limits  BASIC METABOLIC PANEL  POC URINE PREG, ED  TYPE AND SCREEN   ____________________________________________   EKG    ____________________________________________    RADIOLOGY  US Transvaginal Non-OB  Result Date: 07/04/2020 CLINICAL DATA:  Vaginal bleeding, vaginal mass seen on exam EXAM: TRANSABDOMINAL AND TRANSVAGINAL ULTRASOUND OF PELVIS TECHNIQUE: Both transabdominal and transvaginal ultrasound examinations of the pelvis were performed. Transabdominal technique was performed for global imaging of the pelvis including uterus, ovaries, adnexal regions, and pelvic cul-de-sac. It was necessary to proceed with endovaginal exam following the transabdominal exam to visualize the ovaries. COMPARISON:  11/03/2015 FINDINGS: Uterus Measurements: 8.4 x 4.3 x 4.9 cm = volume: 93 mL. Right posterior uterine body intramural fibroid measures 1.5 x 1.3 x 1.5 cm. Right anterior uterine body subserosal fibroid measures 2.5 x 2.1 x 2.5 cm. Small nabothian cysts noted at the level of the cervix. Endometrium Thickness: 7 mm.  No focal abnormality visualized. Right ovary Measurements: 2.8 x 1.7 x 2.5 cm = volume: 6 mL. Normal appearance/no adnexal mass. Left ovary Measurements: 3.0 x 1.2 x 2.1 cm = volume: 4 mL. Normal appearance/no adnexal mass. Other findings No abnormal free fluid. IMPRESSION: 1. Two uterine fibroids, the largest measuring up to 2.5 cm. 2. Unremarkable sonographic appearance of the ovaries. Electronically Signed   By: Davina Poke D.O.   On: 07/04/2020 12:48   US Pelvis  Complete  Result Date: 07/04/2020 CLINICAL DATA:  Vaginal bleeding, vaginal mass seen on exam EXAM: TRANSABDOMINAL AND TRANSVAGINAL ULTRASOUND OF PELVIS TECHNIQUE: Both transabdominal and transvaginal ultrasound examinations of the pelvis were performed. Transabdominal technique was performed for global imaging of the pelvis including uterus, ovaries, adnexal regions, and pelvic cul-de-sac. It was necessary to proceed with endovaginal exam following the transabdominal exam to visualize the ovaries. COMPARISON:  11/03/2015 FINDINGS: Uterus Measurements: 8.4 x 4.3 x 4.9 cm = volume: 93 mL. Right posterior uterine body intramural fibroid measures 1.5 x 1.3 x 1.5 cm. Right anterior uterine body subserosal fibroid measures 2.5 x 2.1 x 2.5 cm. Small nabothian cysts noted at the level of the cervix. Endometrium Thickness: 7 mm.  No focal abnormality visualized. Right ovary Measurements: 2.8 x 1.7 x 2.5 cm = volume: 6 mL. Normal appearance/no adnexal mass. Left ovary Measurements: 3.0 x 1.2 x 2.1 cm = volume: 4 mL. Normal appearance/no adnexal mass. Other findings No abnormal free fluid. IMPRESSION: 1. Two uterine fibroids, the largest measuring up to 2.5 cm. 2. Unremarkable sonographic appearance of the ovaries. Electronically Signed   By: Davina Poke D.O.   On: 07/04/2020 12:48    ____________________________________________   PROCEDURES Procedures  ____________________________________________  DIFFERENTIAL DIAGNOSIS   Uterine fibroid, dysfunctional uterine bleeding, vaginal mass, cervical cancer  CLINICAL IMPRESSION / ASSESSMENT AND PLAN / ED COURSE  Medications ordered in the ED: Medications  oxyCODONE-acetaminophen (PERCOCET/ROXICET) 5-325 MG per tablet 1 tablet (1 tablet Oral Given 07/04/20 1358)  ketorolac (TORADOL) tablet 10 mg (10 mg Oral Given 07/04/20 1355)    Pertinent labs & imaging results that were available during my care of the patient were reviewed by me and considered in my  medical decision making (see chart for details).  Meagan Powers was evaluated in Emergency Department on 07/04/2020 for the symptoms described in the history of present illness. She was evaluated in the context of the global COVID-19 pandemic, which necessitated consideration that the patient might be at risk for infection with the SARS-CoV-2 virus that causes COVID-19. Institutional protocols and algorithms that pertain to the evaluation of patients at risk for COVID-19 are in a state of rapid change based on information released by regulatory bodies including the CDC and federal and state organizations.  These policies and algorithms were followed during the patient's care in the ED.   Patient presents with abnormal vaginal bleeding after recent..  Pelvic exam concerning for soft tissue mass of the vagina.  Vital signs are unremarkable, labs are normal with a hemoglobin of 12.  No evidence of worrisome hemorrhage.  Will obtain pelvic ultrasound to further evaluate.  Clinical Course as of Jul 05 1403  [facility] Jul 04, 2020  1344 Ultrasound pelvis shows 2 uterine fibroids.  No other significant abnormalities.  Case discussed with Dr. Marcelline Mates who recommends OCP taper/Provera and close follow-up in the gynecology clinic.   [PS]    Clinical Course User Index [PS] Carrie Mew, MD     ____________________________________________   FINAL CLINICAL IMPRESSION(S) / ED DIAGNOSES    Final diagnoses:  Vaginal bleeding  Intramural and submucous leiomyoma of uterus     ED Discharge Orders         Ordered    medroxyPROGESTERone (PROVERA) 10 MG tablet  3 times daily        07/04/20 1400          Portions of this note were generated with dragon dictation software. Dictation errors may occur despite best attempts at proofreading.   Carrie Mew, MD 07/04/20 1404

## 2020-07-04 NOTE — Discharge Instructions (Signed)
Your labs today were normal, and your ultrasound showed 2 uterine fibroids.  These may be the cause of your bleeding.  I discussed your results and exam findings with Dr. Marcelline Mates from [facility], and she recommends following up with them in clinic this week.  In the meantime, Please take Provera three times a day as prescribed to help stop the bleeding.

## 2020-07-04 NOTE — ED Notes (Signed)
AAOx3.  Skin warm and dry.  NAD 

## 2020-07-04 NOTE — ED Triage Notes (Signed)
Pt c/o heavy vaginal bleeding since 06/28/20, states she is passing clots , more then 1pad an hour, states when she stands up it gushes out.

## 2020-07-26 ENCOUNTER — Ambulatory Visit (HOSPITAL_COMMUNITY)
Admission: RE | Admit: 2020-07-26 | Discharge: 2020-07-26 | Disposition: A | Discharge status: Home / Self Care | Payer: Medicaid Other | Source: Ambulatory Visit | Attending: Internal Medicine | Admitting: Internal Medicine

## 2020-07-26 ENCOUNTER — Other Ambulatory Visit: Payer: Self-pay

## 2020-07-26 DIAGNOSIS — G35 Multiple sclerosis: Secondary | ICD-10-CM | POA: Diagnosis present

## 2020-07-26 MED ORDER — SODIUM CHLORIDE 0.9 % IV SOLN
300.0000 mg | INTRAVENOUS | Status: DC
  Administered 2020-07-26: 300 mg via INTRAVENOUS
  Filled 2020-07-26: qty 15

## 2020-07-26 MED ORDER — SODIUM CHLORIDE 0.9 % IV SOLN
INTRAVENOUS | Status: DC | PRN
  Administered 2020-07-26: 250 mL via INTRAVENOUS

## 2020-07-26 NOTE — Progress Notes (Signed)
PATIENT [facility]ARE C[facility] NOTE  Diagnosis:G35   Provider:Potter, Zachary, MD   Procedure:Tysabri infusion   Note:Patient received Tysabri infusion via PIV. Patient tolerated infusion well with no adverse reaction. Vital signs remained stable. Patient declined to wait for 1 hour post-infusion. Discharge instructions given. Patient to come back every 6 weeks for infusion. Alert, oriented and ambulatory at discharge. 

## 2020-07-26 NOTE — Discharge Instructions (Signed)
Natalizumab injection What is this medicine? NATALIZUMAB (na ta LIZ you mab) is used to treat relapsing multiple sclerosis. This drug is not a cure. It is also used to treat Crohn's disease. This medicine may be used for other purposes; ask your health care provider or pharmacist if you have questions. COMMON BRAND NAME(S): Tysabri What should I tell my health care provider before I take this medicine? They need to know if you have any of these conditions:  immune system problems  progressive multifocal leukoencephalopathy (PML)  an unusual or allergic reaction to natalizumab, other medicines, foods, dyes, or preservatives  pregnant or trying to get pregnant  breast-feeding How should I use this medicine? This medicine is for infusion into a vein. It is given by a health care professional in a hospital or clinic setting. A special MedGuide will be given to you by the pharmacist with each prescription and refill. Be sure to read this information carefully each time. Talk to your pediatrician regarding the use of this medicine in children. This medicine is not approved for use in children. Overdosage: If you think you have taken too much of this medicine contact a poison control center or emergency room at once. NOTE: This medicine is only for you. Do not share this medicine with others. What if I miss a dose? It is important not to miss your dose. Call your doctor or health care professional if you are unable to keep an appointment. What may interact with this medicine? Do not take this medicine with any of the following medications:  biologic medicines such as adalimumab, certolizumab, etanercept, golimumab, infliximab This medicine may also interact with the following medications:  azathioprine  cyclosporine  interferons  6-mercaptopurine  methotrexate  other medicines that lower your chance of fighting an infection  steroid medicines like prednisone or  cortisone  vaccines This list may not describe all possible interactions. Give your health care provider a list of all the medicines, herbs, non-prescription drugs, or dietary supplements you use. Also tell them if you smoke, drink alcohol, or use illegal drugs. Some items may interact with your medicine. What should I watch for while using this medicine? Your condition will be monitored carefully while you are receiving this medicine. Visit your doctor for regular check ups. Tell your doctor or healthcare professional if your symptoms do not start to get better or if they get worse. Stay away from people who are sick. Call your doctor or health care professional for advice if you get a fever, chills or sore throat, or other symptoms of a cold or flu. Do not treat yourself. In some patients, this medicine may cause a serious brain infection that may cause death. If you have any problems seeing, thinking, speaking, walking, or standing, tell your doctor right away. If you cannot reach your doctor, get urgent medical care. What side effects may I notice from receiving this medicine? Side effects that you should report to your doctor or health care professional as soon as possible:  allergic reactions like skin rash, itching or hives, swelling of the face, lips, or tongue  breathing problems  changes in vision  chest pain  confusion  depressed mood  dizziness  feeling faint; lightheaded; falls  general ill feeling or flu-like symptoms  loss of memory  missed menstrual periods  muscle weakness  problems with balance, talking, or walking  signs and symptoms of liver injury like dark yellow or brown urine; general ill feeling or flu-like symptoms; light-colored   stools; loss of appetite; nausea; right upper belly pain; unusually weak or tired; yellowing of the eyes or skin  suicidal thoughts, mood changes  unusual bruising or bleeding  unusually weak or tired Side effects that  usually do not require medical attention (report to your doctor or health care professional if they continue or are bothersome):  headache  joint pain  muscle cramps  muscle pain  nausea, vomiting  pain, redness, or irritation at site where injected  tiredness This list may not describe all possible side effects. Call your doctor for medical advice about side effects. You may report side effects to FDA at 1-800-FDA-1088. Where should I keep my medicine? This drug is given in a hospital or clinic and will not be stored at home. NOTE: This sheet is a summary. It may not cover all possible information. If you have questions about this medicine, talk to your doctor, pharmacist, or health care provider.  2020 Elsevier/Gold Standard (2019-02-16 13:20:26)

## 2020-07-27 ENCOUNTER — Encounter: Payer: Self-pay | Admitting: Obstetrics and Gynecology

## 2020-07-27 ENCOUNTER — Ambulatory Visit (INDEPENDENT_AMBULATORY_CARE_PROVIDER_SITE_OTHER): Payer: Medicaid Other | Admitting: Obstetrics and Gynecology

## 2020-07-27 VITALS — BP 94/63 | HR 108 | Ht 62.0 in | Wt 228.5 lb

## 2020-07-27 DIAGNOSIS — R102 Pelvic and perineal pain: Secondary | ICD-10-CM | POA: Diagnosis not present

## 2020-07-27 DIAGNOSIS — D251 Intramural leiomyoma of uterus: Secondary | ICD-10-CM | POA: Diagnosis not present

## 2020-07-27 DIAGNOSIS — N760 Acute vaginitis: Secondary | ICD-10-CM

## 2020-07-27 DIAGNOSIS — N926 Irregular menstruation, unspecified: Secondary | ICD-10-CM

## 2020-07-27 DIAGNOSIS — D252 Subserosal leiomyoma of uterus: Secondary | ICD-10-CM

## 2020-07-27 NOTE — Progress Notes (Signed)
Pt present for ed follow up for irregular bleeding. Pt c/o heavy, painful irregular bleeding with clots.

## 2020-07-27 NOTE — Patient Instructions (Addendum)
Dysfunctional Uterine Bleeding Dysfunctional uterine bleeding is abnormal bleeding from the uterus. Dysfunctional uterine bleeding includes:  A menstrual period that comes earlier or later than usual.  A menstrual period that is lighter or heavier than usual, or has large blood clots.  Vaginal bleeding between menstrual periods.  Skipping one or more menstrual periods.  Vaginal bleeding after sex.  Vaginal bleeding after menopause. Follow these instructions at home: Eating and drinking   Eat well-balanced meals. Include foods that are high in iron, such as liver, meat, shellfish, green leafy vegetables, and eggs.  To prevent or treat constipation, your health care provider may recommend that you: ? Drink enough fluid to keep your urine pale yellow. ? Take over-the-counter or prescription medicines. ? Eat foods that are high in fiber, such as beans, whole grains, and fresh fruits and vegetables. ? Limit foods that are high in fat and processed sugars, such as fried or sweet foods. Medicines  Take over-the-counter and prescription medicines only as told by your health care provider.  Do not change medicines without talking with your health care provider.  Aspirin or medicines that contain aspirin may make the bleeding worse. Do not take those medicines: ? During the week before your menstrual period. ? During your menstrual period.  If you were prescribed iron pills, take them as told by your health care provider. Iron pills help to replace iron that your body loses because of this condition. Activity  If you need to change your sanitary pad or tampon more than one time every 2 hours: ? Lie in bed with your feet raised (elevated). ? Place a cold pack on your lower abdomen. ? Rest as much as possible until the bleeding stops or slows down.  Do not try to lose weight until the bleeding has stopped and your blood iron level is back to normal. General instructions   For two  months, write down: ? When your menstrual period starts. ? When your menstrual period ends. ? When any abnormal vaginal bleeding occurs. ? What problems you notice.  Keep all follow up visits as told by your health care provider. This is important. Contact a health care provider if you:  Feel light-headed or weak.  Have nausea and vomiting.  Cannot eat or drink without vomiting.  Feel dizzy or have diarrhea while you are taking medicines.  Are taking birth control pills or hormones, and you want to change them or stop taking them. Get help right away if:  You develop a fever or chills.  You need to change your sanitary pad or tampon more than one time per hour.  Your vaginal bleeding becomes heavier, or your flow contains clots more often.  You develop pain in your abdomen.  You lose consciousness.  You develop a rash. Summary  Dysfunctional uterine bleeding is abnormal bleeding from the uterus.  It includes menstrual bleeding of abnormal duration, volume, or regularity.  Bleeding after sex and after menopause are also considered dysfunctional uterine bleeding. This information is not intended to replace advice given to you by your health care provider. Make sure you discuss any questions you have with your health care provider. Document Revised: 01/22/2018 Document Reviewed: 01/22/2018 Elsevier Patient Education  2020 [employer].   Uterine Fibroids  Uterine fibroids (leiomyomas) are noncancerous (benign) tumors that can develop in the uterus. Fibroids may also develop in the fallopian tubes, cervix, or tissues (ligaments) near the uterus. You may have one or many fibroids. Fibroids vary in size,  weight, and where they grow in the uterus. Some can become quite large. Most fibroids do not require medical treatment. What are the causes? The cause of this condition is not known. What increases the risk? You are more likely to develop this condition if you:  Are in  your 30s or 40s and have not gone through menopause.  Have a family history of this condition.  Are of African-American descent.  Had your first period at an early age (early menarche).  Have not had any children (nulliparity).  Are overweight or obese. What are the signs or symptoms? Many women do not have any symptoms. Symptoms of this condition may include:  Heavy menstrual bleeding.  Bleeding or spotting between periods.  Pain and pressure in the pelvic area, between the hips.  Bladder problems, such as needing to urinate urgently or more often than usual.  Inability to have children (infertility).  Failure to carry pregnancy to term (miscarriage). How is this diagnosed? This condition may be diagnosed based on:  Your symptoms and medical history.  A physical exam.  A pelvic exam that includes feeling for any tumors.  Imaging tests, such as ultrasound or MRI. How is this treated? Treatment for this condition may include:  Seeing your health care provider for follow-up visits to monitor your fibroids for any changes.  Taking NSAIDs such as ibuprofen, naproxen, or aspirin to reduce pain.  Hormone medicines. These may be taken as a pill, given in an injection, or delivered by a T-shaped device that is inserted into the uterus (intrauterine device, IUD).  Surgery to remove one of the following: ? The fibroids (myomectomy). Your health care provider may recommend this if fibroids affect your fertility and you want to become pregnant. ? The uterus (hysterectomy). ? Blood supply to the fibroids (uterine artery embolization). Follow these instructions at home:  Take over-the-counter and prescription medicines only as told by your health care provider.  Ask your health care provider if you should take iron pills or eat more iron-rich foods, such as dark green, leafy vegetables. Heavy menstrual bleeding can cause low iron levels.  If directed, apply heat to your back  or abdomen to reduce pain. Use the heat source that your health care provider recommends, such as a moist heat pack or a heating pad. ? Place a towel between your skin and the heat source. ? Leave the heat on for 20-30 minutes. ? Remove the heat if your skin turns bright red. This is especially important if you are unable to feel pain, heat, or cold. You may have a greater risk of getting burned.  Pay close attention to your menstrual cycle. Tell your health care provider about any changes, such as: ? Increased blood flow that requires you to use more pads or tampons than usual. ? A change in the number of days that your period lasts. ? A change in symptoms that are associated with your period, such as back pain or cramps in your abdomen.  Keep all follow-up visits as told by your health care provider. This is important, especially if your fibroids need to be monitored for any changes. Contact a health care provider if you:  Have pelvic pain, back pain, or cramps in your abdomen that do not get better with medicine or heat.  Develop new bleeding between periods.  Have increased bleeding during or between periods.  Feel unusually tired or weak.  Feel light-headed. Get help right away if you:  Faint.  Have  pelvic pain that suddenly gets worse.  Have severe vaginal bleeding that soaks a tampon or pad in 30 minutes or less. Summary  Uterine fibroids are noncancerous (benign) tumors that can develop in the uterus.  The exact cause of this condition is not known.  Most fibroids do not require medical treatment unless they affect your ability to have children (fertility).  Contact a health care provider if you have pelvic pain, back pain, or cramps in your abdomen that do not get better with medicines.  Make sure you know what symptoms should cause you to get help right away. This information is not intended to replace advice given to you by your health care provider. Make sure you  discuss any questions you have with your health care provider. Document Revised: 07/26/2017 Document Reviewed: 07/09/2017 Elsevier Patient Education  2020 [employer].

## 2020-07-27 NOTE — Progress Notes (Addendum)
GYNECOLOGY PROGRESS NOTE  Subjective:    Patient ID: Meagan Powers, female    DOB: 1986/07/29, 34 y.o.   MRN: [id number]  HPI  Patient is a 34 y.o. [id number] female who presents for follow-up of recent Emergency Room visit.  Patient was seen in the Emergency Room approximately 3 weeks ago (07/04/2020).  She presented with complaints of an irregular cycle with passage of heavy clots and sometimes having to change pads hourly.  Cycle was noted to last approximately 14 days.  She was on the NuvaRing which she used continuously (3 months on, 7 days off, however patient does report that she usually only leaves it out for no more than 3 days before resuming use) but she ran out of her prescription.  She was noting dizziness, and lightheadedness with standing. Denied SOB and chest pain at tha ttime.  Patient reports that she had labs and an ultrasound performed in the ED, and was diagnosed with fibroids at that time.  Of note patient was previously a patient of Dr. Hassell Done Defrancesco who retired in 2019.  Patient has only been seen one other time in the office since then for problem visit.  Additionally, [name] notes that she was diagnosed with MS in January or February of this year.  Is currently taking a medication called [place] (infusion treatments received every 6 weeks).  She notes that when she receives the infusions that she gets a yeast infection.  Her PCP usually calls in 1 Diflucan tablet for her to take, however patient notes that she usually is still symptomatic after 1 dose tablet.  Desires to also be checked to see if she has an infection.  GYN History:  Last pap smear: patient cannot recall.  Has been at least 3 years. Reports prior history of abnormal pap smears; also h/o LEEP.  History of STI's: patient denies Patient is sexually active, same partner for several years.   Menstrual History: Menarche age: 41 or 37. Patient's last menstrual period was 06/28/2020 (exact date). Period  Duration (Days): 14 Period Pattern: (!) Irregular Menstrual Flow: (S) Heavy (with clots) Menstrual Control: Maxi pad, Other (Comment) Menstrual Control Change Freq (Hours): 2-3 Dysmenorrhea: (!) Severe Dysmenorrhea Symptoms: Cramping, Nausea, Headache    The following portions of the patient's history were reviewed and updated as appropriate:  She  has a past medical history of Abnormal uterine bleeding (AUB), Acanthosis nigricans, AR (allergic rhinitis), Cervical dysplasia, Dysmenorrhea, Dyspareunia in female, Incompetent cervix, Increased BMI, Migraine, Migraine, and Pre-diabetes.   She  has a past surgical history that includes Hysteroscopy with D & C; laparoscopy; Cervical biopsy w/ loop electrode excision; Cervical cerclage; Cholecystectomy; Dilation and curettage of uterus; and Colonoscopy with propofol (N/A, 10/15/2018).   Her family history includes Hypertension in her mother.   She  reports that she has never smoked. She has never used smokeless tobacco. She reports current alcohol use. She reports that she does not use drugs.    Current Outpatient Medications on File Prior to Visit  Medication Sig Dispense Refill  . EPINEPHrine 0.3 mg/0.3 mL IJ SOAJ injection Inject 0.3 mg into the muscle.    . etonogestrel-ethinyl estradiol (NUVARING) 0.12-0.015 MG/24HR vaginal ring Insert NuvaRing intravaginal every 21 days x 4 rings, then remove last ring for 7 days before restarting cycle 4 each 3  . fluticasone (FLONASE) 50 MCG/ACT nasal spray Place 2 sprays into both nostrils daily.    . SUMAtriptan (IMITREX) 50 MG tablet Take 50 mg by mouth  every 2 (two) hours as needed for migraine. May repeat in 2 hours if headache persists or recurs.    Marland Kitchen ibuprofen (ADVIL,MOTRIN) 800 MG tablet Take 800 mg by mouth every 8 (eight) hours as needed. (Patient not taking: Reported on 07/27/2020)     No current facility-administered medications on file prior to visit.   She is allergic to shellfish allergy  and iodine..  Review of Systems Pertinent items noted in HPI and remainder of comprehensive ROS otherwise negative.   Objective:   Blood pressure 94/63, pulse (!) 108, height 5\' 2"  (1.575 m), weight 228 lb 8 oz (103.6 kg), last menstrual period 06/28/2020. Body mass index is 41.79 kg/m.  General appearance: cooperative and no distress, morbid obesity Abdomen: soft, non-tender; bowel sounds normal; no masses,  no organomegaly Pelvic: external genitalia normal, rectovaginal septum normal.  Vagina without discharge.  Cervix normal appearing, no lesions and no motion tenderness.  Uterus mobile, nontender, normal shape and size.  Adnexae non-palpable, mildly tender on right, non-tender on left.   Extremities: extremities normal, atraumatic, no cyanosis or edema Neurologic: Grossly normal    Labs:  Admission on 07/04/2020, Discharged on 07/04/2020  Component Date Value Ref Range Status  . Preg Test, Ur 07/04/2020 NEGATIVE  NEGATIVE Final   Comment:        THE SENSITIVITY OF THIS METHODOLOGY IS >24 mIU/mL   . Sodium 07/04/2020 136  135 - 145 mmol/L Final  . Potassium 07/04/2020 3.9  3.5 - 5.1 mmol/L Final  . Chloride 07/04/2020 102  98 - 111 mmol/L Final  . CO2 07/04/2020 24  22 - 32 mmol/L Final  . Glucose, Bld 07/04/2020 99  70 - 99 mg/dL Final   Glucose reference range applies only to samples taken after fasting for at least 8 hours.  . BUN 07/04/2020 12  6 - 20 mg/dL Final  . Creatinine, Ser 07/04/2020 0.79  0.44 - 1.00 mg/dL Final  . Calcium 07/04/2020 9.2  8.9 - 10.3 mg/dL Final  . GFR, Estimated 07/04/2020 >60  >60 mL/min Final   Comment: (NOTE) Calculated using the CKD-EPI Creatinine Equation (2021)   . Anion gap 07/04/2020 10  5 - 15 Final   Performed at [facility], [address] [place], [place] [address]  . WBC 07/04/2020 12.1* 4.0 - 10.5 K/uL Final  . RBC 07/04/2020 4.55  3.87 - 5.11 MIL/uL Final  . Hemoglobin 07/04/2020 12.4  12.0 - 15.0 g/dL Final  .  HCT 07/04/2020 38.2  36 - 46 % Final  . MCV 07/04/2020 84.0  80.0 - 100.0 fL Final  . MCH 07/04/2020 27.3  26.0 - 34.0 pg Final  . MCHC 07/04/2020 32.5  30.0 - 36.0 g/dL Final  . RDW 07/04/2020 12.8  11.5 - 15.5 % Final  . Platelets 07/04/2020 333  150 - 400 K/uL Final  . nRBC 07/04/2020 0.6* 0.0 - 0.2 % Final   Performed at [facility], [address] [place], [place] [address]  . ABO/RH(D) 07/04/2020 O POS   Final  . Antibody Screen 07/04/2020 NEG   Final  . Sample Expiration 07/04/2020    Final                   Value:07/07/2020,2359 Performed at [facility], [address] [place],  [address]     Imaging:  US Transvaginal Non-OB CLINICAL DATA:  Vaginal bleeding, vaginal mass seen on exam  EXAM: TRANSABDOMINAL AND TRANSVAGINAL ULTRASOUND OF PELVIS  TECHNIQUE:  Both transabdominal and transvaginal ultrasound examinations of the pelvis were performed. Transabdominal technique was performed for global imaging of the pelvis including uterus, ovaries, adnexal regions, and pelvic cul-de-sac. It was necessary to proceed with endovaginal exam following the transabdominal exam to visualize the ovaries.  COMPARISON:  11/03/2015  FINDINGS: Uterus  Measurements: 8.4 x 4.3 x 4.9 cm = volume: 93 mL. Right posterior uterine body intramural fibroid measures 1.5 x 1.3 x 1.5 cm. Right anterior uterine body subserosal fibroid measures 2.5 x 2.1 x 2.5 cm. Small nabothian cysts noted at the level of the cervix.  Endometrium  Thickness: 7 mm.  No focal abnormality visualized.  Right ovary  Measurements: 2.8 x 1.7 x 2.5 cm = volume: 6 mL. Normal appearance/no adnexal mass.  Left ovary  Measurements: 3.0 x 1.2 x 2.1 cm = volume: 4 mL. Normal appearance/no adnexal mass.  Other findings  No abnormal free fluid.  IMPRESSION: 1. Two uterine fibroids, the largest measuring up to 2.5 cm. 2. Unremarkable sonographic appearance of the  ovaries.  Electronically Signed   By: Davina Poke D.O.   On: 07/04/2020 12:48 US Pelvis Complete CLINICAL DATA:  Vaginal bleeding, vaginal mass seen on exam  EXAM: TRANSABDOMINAL AND TRANSVAGINAL ULTRASOUND OF PELVIS  TECHNIQUE: Both transabdominal and transvaginal ultrasound examinations of the pelvis were performed. Transabdominal technique was performed for global imaging of the pelvis including uterus, ovaries, adnexal regions, and pelvic cul-de-sac. It was necessary to proceed with endovaginal exam following the transabdominal exam to visualize the ovaries.  COMPARISON:  11/03/2015  FINDINGS: Uterus  Measurements: 8.4 x 4.3 x 4.9 cm = volume: 93 mL. Right posterior uterine body intramural fibroid measures 1.5 x 1.3 x 1.5 cm. Right anterior uterine body subserosal fibroid measures 2.5 x 2.1 x 2.5 cm. Small nabothian cysts noted at the level of the cervix.  Endometrium  Thickness: 7 mm.  No focal abnormality visualized.  Right ovary  Measurements: 2.8 x 1.7 x 2.5 cm = volume: 6 mL. Normal appearance/no adnexal mass.  Left ovary  Measurements: 3.0 x 1.2 x 2.1 cm = volume: 4 mL. Normal appearance/no adnexal mass.  Other findings  No abnormal free fluid.  IMPRESSION: 1. Two uterine fibroids, the largest measuring up to 2.5 cm. 2. Unremarkable sonographic appearance of the ovaries.  Electronically Signed   By: Davina Poke D.O.   On: 07/04/2020 12:48   Assessment:   1. Irregular menstrual bleeding   2. Pelvic pain   3. Intramural and subserous leiomyoma of uterus   4. Morbid obesity with BMI of 40.0-44.9, adult ([place])    Plan:   1.  Irregular menstrual bleeding -irregular bleeding may be secondary to patient abruptly discontinuing her birth control as she had run out of her prescription.  Also discussed if she were to continue using continuously, she should leave the ring out for the full 7 days at the end of 3 months. This will help to better  control her cycles and decrease the risk of breakthrough or prolonged bleeding. Also discussed alternative methods of contraception such as LARC (Nexplanon or hormonal IUD) which could also likely help to regulate her bleeding. Patient reports use of an IUD in the past, noting some issues with her IUD after several years of use stating that the IUD became "crooked" and had to be taken out as it was causing pain. Advised that this was not likely to happen a second time. Patient is willing to consider but would like to think about it.  Still would like her NuvaRing called into the pharmacy for now. 2. Fibroid uterus -patient with 2 small fibroids, intramural and subserosal. Subserosal fibroid not likely to cause any issues and is small. Discussed that intramural fibroids can cause problems with bleeding however current one is also small. Would likely not interfere with use of IUD if desired. 3. Multiple Sclerosis -currently receiving infusion treatments. Reports that she will continue her infusion treatments for the next 6 months. Has issues with the infusion treatments causing vaginal yeast infections. Notes that her PCP typically will call in only one Diflucan pill. Will give patient prescription for extended treatment, where she can take a dose every 3 days for up to 3 doses. Also discussed option of using vaginal OTC Monistat along with 1 tablet of Diflucan to see if this will also help her symptoms. No evidence of infection noted on today's exam. 4. Patient to follow-up in 3 months for annual exam and also reassessment of her bleeding after being back on the NuvaRing. Patient notes that if her symptoms do not improve she will consider use of LARC method.   A total of 25 minutes were spent face-to-face with the patient during this encounter and over half of that time dealt with review of records, data collection, and counseling.    Rubie Maid, MD [facility]

## 2020-07-28 NOTE — Telephone Encounter (Signed)
Patient called in regards to a MyChart message she had sent in about how her AVS included information that did not pertain to her. Patient was wanting to know if this would be corrected.

## 2020-07-29 ENCOUNTER — Telehealth: Payer: Self-pay

## 2020-07-29 ENCOUNTER — Other Ambulatory Visit: Payer: Self-pay

## 2020-07-29 MED ORDER — ETONOGESTREL-ETHINYL ESTRADIOL 0.12-0.015 MG/24HR VA RING
VAGINAL_RING | VAGINAL | 3 refills | Status: DC
Start: 2020-07-29 — End: 2021-06-27

## 2020-07-29 MED ORDER — FLUCONAZOLE 150 MG PO TABS: ORAL_TABLET | ORAL | 0 refills | Status: DC

## 2020-07-29 NOTE — Telephone Encounter (Signed)
Spoke to pt concerning her call to the office. Pt is aware that her medication Nuvuring has been refilled along with a rx of diflucan.

## 2020-07-29 NOTE — Telephone Encounter (Signed)
Pt called in and stated that she needs a refill on her Nuvaring sent to the [facility] in [place]. Please advise

## 2020-09-06 ENCOUNTER — Other Ambulatory Visit: Payer: Self-pay

## 2020-09-06 ENCOUNTER — Ambulatory Visit (HOSPITAL_COMMUNITY)
Admission: RE | Admit: 2020-09-06 | Discharge: 2020-09-06 | Disposition: A | Discharge status: Home / Self Care | Payer: Medicaid Other | Source: Ambulatory Visit | Attending: Internal Medicine | Admitting: Internal Medicine

## 2020-09-06 DIAGNOSIS — G35 Multiple sclerosis: Secondary | ICD-10-CM | POA: Insufficient documentation

## 2020-09-06 MED ORDER — SODIUM CHLORIDE 0.9 % IV SOLN
300.0000 mg | INTRAVENOUS | Status: DC
  Administered 2020-09-06: 300 mg via INTRAVENOUS
  Filled 2020-09-06: qty 15

## 2020-09-06 MED ORDER — SODIUM CHLORIDE 0.9 % IV SOLN
INTRAVENOUS | Status: DC | PRN
  Administered 2020-09-06: 250 mL via INTRAVENOUS

## 2020-09-06 NOTE — Progress Notes (Signed)
Patient received IVTysabri as ordered by Gurney Maxin MD.Declined the 60 minutes post infusion observation.Tolerated well, vitals stable, discharge instructions given, verbalized understanding. Alert, oriented and ambulatory at the time of discharge

## 2020-09-20 ENCOUNTER — Other Ambulatory Visit: Payer: Self-pay | Admitting: Obstetrics and Gynecology

## 2020-10-18 ENCOUNTER — Other Ambulatory Visit: Payer: Self-pay

## 2020-10-18 ENCOUNTER — Non-Acute Institutional Stay (HOSPITAL_COMMUNITY)
Admission: RE | Admit: 2020-10-18 | Discharge: 2020-10-18 | Disposition: A | Discharge status: Home / Self Care | Payer: Medicaid Other | Source: Ambulatory Visit | Attending: Internal Medicine | Admitting: Internal Medicine

## 2020-10-18 DIAGNOSIS — G35 Multiple sclerosis: Secondary | ICD-10-CM | POA: Diagnosis present

## 2020-10-18 MED ORDER — SODIUM CHLORIDE 0.9 % IV SOLN
300.0000 mg | Freq: Once | INTRAVENOUS | Status: AC
  Administered 2020-10-18: 300 mg via INTRAVENOUS
  Filled 2020-10-18: qty 15

## 2020-10-18 MED ORDER — SODIUM CHLORIDE 0.9 % IV SOLN
INTRAVENOUS | Status: DC | PRN
  Administered 2020-10-18: 250 mL via INTRAVENOUS

## 2020-10-18 NOTE — Progress Notes (Signed)
PATIENT CARE CENTER NOTE  Diagnosis:G35   Provider:Potter, Alroy Dust, MD   Procedure:Tysabri infusion   Note:Patient received Tysabri infusion via PIV. Patient tolerated infusion well with no adverse reaction. Vital signs remained stable. Patient declined to wait for 1 hour post-infusion. Discharge instructions given. Patient to come back every 6 weeks for infusion. Alert, oriented and ambulatory at discharge.

## 2020-11-18 ENCOUNTER — Other Ambulatory Visit: Payer: Self-pay | Admitting: Neurology

## 2020-11-18 DIAGNOSIS — G35 Multiple sclerosis: Secondary | ICD-10-CM

## 2020-11-29 ENCOUNTER — Non-Acute Institutional Stay (HOSPITAL_COMMUNITY)
Admission: RE | Admit: 2020-11-29 | Discharge: 2020-11-29 | Disposition: A | Discharge status: Home / Self Care | Payer: Medicaid Other | Source: Ambulatory Visit | Attending: Internal Medicine | Admitting: Internal Medicine

## 2020-11-29 ENCOUNTER — Other Ambulatory Visit: Payer: Self-pay

## 2020-11-29 ENCOUNTER — Other Ambulatory Visit: Payer: Self-pay | Admitting: Neurology

## 2020-11-29 DIAGNOSIS — G35 Multiple sclerosis: Secondary | ICD-10-CM | POA: Insufficient documentation

## 2020-11-29 MED ORDER — SODIUM CHLORIDE 0.9 % IV SOLN
300.0000 mg | INTRAVENOUS | Status: DC
  Administered 2020-11-29: 300 mg via INTRAVENOUS
  Filled 2020-11-29: qty 15

## 2020-11-29 MED ORDER — SODIUM CHLORIDE 0.9 % IV SOLN
INTRAVENOUS | Status: DC | PRN
  Administered 2020-11-29: 250 mL via INTRAVENOUS

## 2020-11-29 NOTE — Discharge Instructions (Signed)
Natalizumab injection What is this medicine? NATALIZUMAB (na ta LIZ you mab) is used to treat relapsing multiple sclerosis. This drug is not a cure. It is also used to treat Crohn's disease. This medicine may be used for other purposes; ask your health care provider or pharmacist if you have questions. COMMON BRAND NAME(S): Tysabri What should I tell my health care provider before I take this medicine? They need to know if you have any of these conditions:  immune system problems  progressive multifocal leukoencephalopathy (PML)  an unusual or allergic reaction to natalizumab, other medicines, foods, dyes, or preservatives  pregnant or trying to get pregnant  breast-feeding How should I use this medicine? This medicine is for infusion into a vein. It is given by a health care professional in a hospital or clinic setting. A special MedGuide will be given to you by the pharmacist with each prescription and refill. Be sure to read this information carefully each time. Talk to your pediatrician regarding the use of this medicine in children. This medicine is not approved for use in children. Overdosage: If you think you have taken too much of this medicine contact a poison control center or emergency room at once. NOTE: This medicine is only for you. Do not share this medicine with others. What if I miss a dose? It is important not to miss your dose. Call your doctor or health care professional if you are unable to keep an appointment. What may interact with this medicine? Do not take this medicine with any of the following medications:  biologic medicines such as adalimumab, certolizumab, etanercept, golimumab, infliximab This medicine may also interact with the following medications:  azathioprine  cyclosporine  interferons  6-mercaptopurine  methotrexate  other medicines that lower your chance of fighting an infection  steroid medicines like prednisone or  cortisone  vaccines This list may not describe all possible interactions. Give your health care provider a list of all the medicines, herbs, non-prescription drugs, or dietary supplements you use. Also tell them if you smoke, drink alcohol, or use illegal drugs. Some items may interact with your medicine. What should I watch for while using this medicine? Your condition will be monitored carefully while you are receiving this medicine. Visit your doctor for regular check ups. Tell your doctor or healthcare professional if your symptoms do not start to get better or if they get worse. Stay away from people who are sick. Call your doctor or health care professional for advice if you get a fever, chills or sore throat, or other symptoms of a cold or flu. Do not treat yourself. In some patients, this medicine may cause a serious brain infection that may cause death. If you have any problems seeing, thinking, speaking, walking, or standing, tell your doctor right away. If you cannot reach your doctor, get urgent medical care. What side effects may I notice from receiving this medicine? Side effects that you should report to your doctor or health care professional as soon as possible:  allergic reactions like skin rash, itching or hives, swelling of the face, lips, or tongue  breathing problems  changes in vision  chest pain  confusion  depressed mood  dizziness  feeling faint; lightheaded; falls  general ill feeling or flu-like symptoms  loss of memory  missed menstrual periods  muscle weakness  problems with balance, talking, or walking  signs and symptoms of liver injury like dark yellow or brown urine; general ill feeling or flu-like symptoms; light-colored  stools; loss of appetite; nausea; right upper belly pain; unusually weak or tired; yellowing of the eyes or skin  suicidal thoughts, mood changes  unusual bruising or bleeding  unusually weak or tired Side effects that  usually do not require medical attention (report to your doctor or health care professional if they continue or are bothersome):  headache  joint pain  muscle cramps  muscle pain  nausea, vomiting  pain, redness, or irritation at site where injected  tiredness This list may not describe all possible side effects. Call your doctor for medical advice about side effects. You may report side effects to FDA at 1-800-FDA-1088. Where should I keep my medicine? This drug is given in a hospital or clinic and will not be stored at home. NOTE: This sheet is a summary. It may not cover all possible information. If you have questions about this medicine, talk to your doctor, pharmacist, or health care provider.  2021 Elsevier/Gold Standard (2019-02-16 13:20:26)

## 2020-11-29 NOTE — Progress Notes (Signed)
Patient received IV Tysabri as ordered by Gurney Maxin, MD. Declined the 60 minutes post infusion observation.Tolerated well, vitals stable, discharge instructions given, verbalized understanding. Patient alert, oriented and ambulatory at the time of discharge.

## 2020-12-02 ENCOUNTER — Other Ambulatory Visit: Payer: Self-pay

## 2020-12-02 ENCOUNTER — Ambulatory Visit
Admission: RE | Admit: 2020-12-02 | Discharge: 2020-12-02 | Disposition: A | Discharge status: Home / Self Care | Payer: Medicaid Other | Source: Ambulatory Visit | Attending: Neurology | Admitting: Neurology

## 2020-12-02 DIAGNOSIS — G35 Multiple sclerosis: Secondary | ICD-10-CM | POA: Diagnosis not present

## 2020-12-02 MED ORDER — GADOBUTROL 1 MMOL/ML IV SOLN
10.0000 mL | Freq: Once | INTRAVENOUS | Status: AC | PRN
  Administered 2020-12-02: 10 mL via INTRAVENOUS

## 2020-12-05 ENCOUNTER — Ambulatory Visit: Payer: Medicaid Other

## 2020-12-06 ENCOUNTER — Ambulatory Visit: Payer: Medicaid Other

## 2021-01-05 ENCOUNTER — Ambulatory Visit: Payer: Medicaid Other | Attending: Otolaryngology

## 2021-01-05 DIAGNOSIS — G4733 Obstructive sleep apnea (adult) (pediatric): Secondary | ICD-10-CM | POA: Diagnosis not present

## 2021-01-05 DIAGNOSIS — R5383 Other fatigue: Secondary | ICD-10-CM | POA: Diagnosis present

## 2021-01-06 ENCOUNTER — Other Ambulatory Visit: Payer: Self-pay

## 2021-01-10 ENCOUNTER — Other Ambulatory Visit: Payer: Self-pay

## 2021-01-10 ENCOUNTER — Non-Acute Institutional Stay (HOSPITAL_COMMUNITY)
Admission: RE | Admit: 2021-01-10 | Discharge: 2021-01-10 | Disposition: A | Discharge status: Home / Self Care | Payer: Medicaid Other | Source: Ambulatory Visit | Attending: Internal Medicine | Admitting: Internal Medicine

## 2021-01-10 DIAGNOSIS — G35 Multiple sclerosis: Secondary | ICD-10-CM | POA: Insufficient documentation

## 2021-01-10 MED ORDER — SODIUM CHLORIDE 0.9 % IV SOLN
INTRAVENOUS | Status: DC | PRN
  Administered 2021-01-10: 250 mL via INTRAVENOUS

## 2021-01-10 MED ORDER — SODIUM CHLORIDE 0.9 % IV SOLN
300.0000 mg | INTRAVENOUS | Status: DC
  Administered 2021-01-10: 300 mg via INTRAVENOUS
  Filled 2021-01-10: qty 15

## 2021-01-10 NOTE — Progress Notes (Signed)
PATIENT CARE CENTER NOTE  Diagnosis:G35   Provider:Potter, Alroy Dust, MD   Procedure:Tysabri infusion   Note:Patient received Tysabri infusion via PIV. Patient tolerated infusion well with no adverse reaction. Vital signs remained stable. Patient declined to wait for 1 hour post-infusion. AVS offered but patient refused. Patient to come back every 6 weeks for infusion. Alert, oriented and ambulatory at discharge.

## 2021-02-16 ENCOUNTER — Telehealth: Payer: Self-pay

## 2021-02-16 NOTE — Telephone Encounter (Signed)
Pt called in and stated that her infusions were NOW going to be every 8 weeks instead of 6 weeks

## 2021-02-21 ENCOUNTER — Non-Acute Institutional Stay (HOSPITAL_COMMUNITY)
Admission: RE | Admit: 2021-02-21 | Discharge: 2021-02-21 | Disposition: A | Discharge status: Home / Self Care | Payer: Medicaid Other | Source: Ambulatory Visit | Attending: Internal Medicine | Admitting: Internal Medicine

## 2021-02-21 ENCOUNTER — Other Ambulatory Visit: Payer: Self-pay

## 2021-02-21 DIAGNOSIS — G35 Multiple sclerosis: Secondary | ICD-10-CM | POA: Insufficient documentation

## 2021-02-21 MED ORDER — SODIUM CHLORIDE 0.9 % IV SOLN
INTRAVENOUS | Status: DC | PRN
  Administered 2021-02-21: 10:00:00 250 mL via INTRAVENOUS

## 2021-02-21 MED ORDER — SODIUM CHLORIDE 0.9 % IV SOLN
300.0000 mg | INTRAVENOUS | Status: DC
  Administered 2021-02-21: 11:00:00 300 mg via INTRAVENOUS
  Filled 2021-02-21: qty 15

## 2021-02-21 NOTE — Progress Notes (Signed)
CENTER NOTE   Diagnosis: Multiple Sclerosis       Provider: Darlin Drop MD     Procedure: Fritzi Mandes IV     Note: Patient received Tysabri infusion via PIV. Authorization not valid in [employer]; however Renato Gails RN spoke with Lowella Grip at [facility] who received verbal approval to administer Tysabri today from pt's neurologist Dr. Darlin Drop. Tolerated infusion well with no adverse reaction. Vital signs stable.  Patient declined to stay for the 1 hour post-infusion observation. Pt states she is now going to receive infusion every 8 weeks ( instead of Q6 weeks per provider), instructed to  make next appointment before leaving, verbalized understanding. Pt declined AVS. Alert, oriented and ambulatory at discharge.

## 2021-04-18 ENCOUNTER — Non-Acute Institutional Stay (HOSPITAL_COMMUNITY)
Admission: RE | Admit: 2021-04-18 | Discharge: 2021-04-18 | Disposition: A | Discharge status: Home / Self Care | Payer: Medicaid Other | Source: Ambulatory Visit | Attending: Internal Medicine | Admitting: Internal Medicine

## 2021-04-18 ENCOUNTER — Other Ambulatory Visit: Payer: Self-pay

## 2021-04-18 DIAGNOSIS — G35 Multiple sclerosis: Secondary | ICD-10-CM | POA: Diagnosis not present

## 2021-04-18 MED ORDER — SODIUM CHLORIDE 0.9 % IV SOLN
300.0000 mg | INTRAVENOUS | Status: DC
  Administered 2021-04-18: 300 mg via INTRAVENOUS
  Filled 2021-04-18: qty 15

## 2021-04-18 MED ORDER — SODIUM CHLORIDE 0.9 % IV SOLN
INTRAVENOUS | Status: DC | PRN
  Administered 2021-04-18: 250 mL via INTRAVENOUS

## 2021-04-18 NOTE — Progress Notes (Signed)
PATIENT CARE CENTER NOTE    Diagnosis: G35     Provider: Gurney Maxin, MD     Procedure: Tysabri infusion     Note: Patient received Tysabri infusion via PIV. Patient tolerated infusion well with no adverse reaction. Vital signs remained stable. Patient declined to wait for 1 hour post-infusion. AVS offered but patient refused. Patient to come back every 8 weeks for infusion. Alert, oriented and ambulatory at discharge.

## 2021-05-07 IMAGING — RF DG SPINAL PUNCT LUMBAR DIAG WITH FL CT GUIDANCE
1 series · 1 of 1 positions shown · non-contrast
Comparison: none

CLINICAL DATA: Multiple sclerosis.

[Series 1: cp_standard · 0.28mm/px · 1 of 1 slices shown]
[im 1/1]
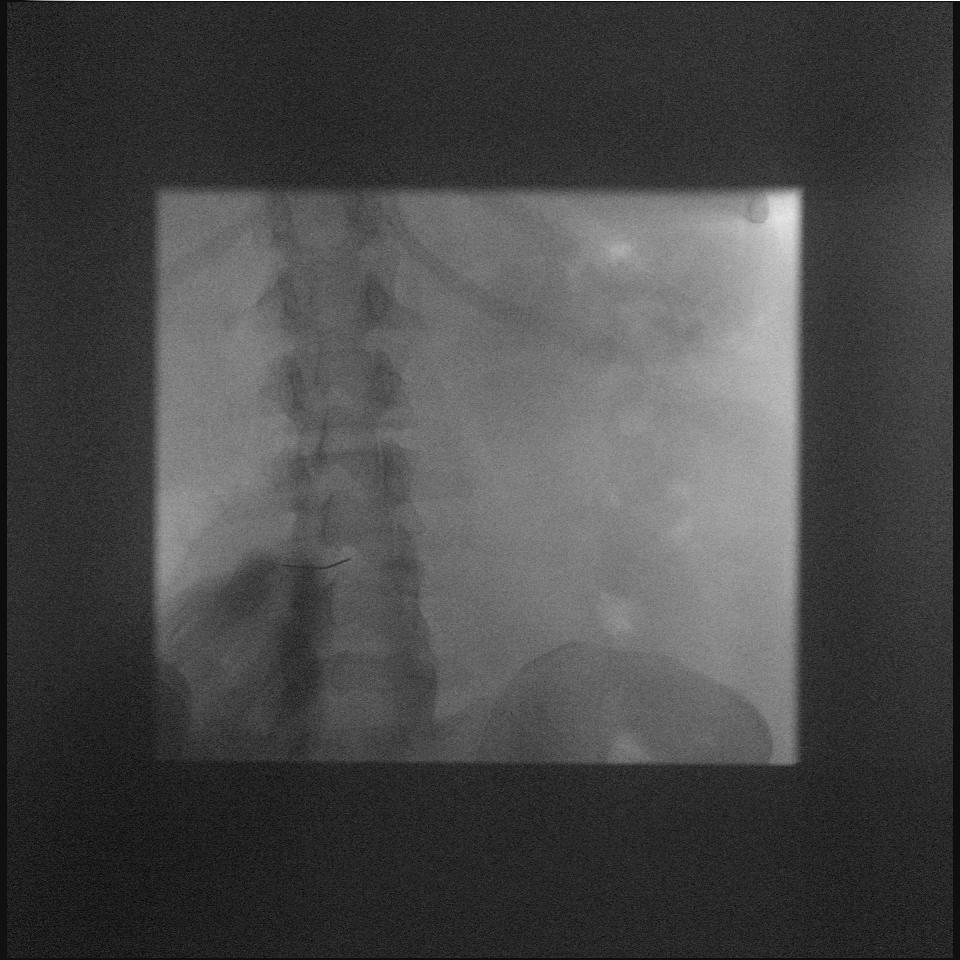

[1 of 1 positions shown; findings below may reference images not displayed]

EXAM:
DIAGNOSTIC LUMBAR PUNCTURE UNDER FLUOROSCOPIC GUIDANCE

FLUOROSCOPY TIME:  Fluoroscopy Time: 54 seconds

Radiation Exposure Index (if provided by the fluoroscopic device):
8.2 mGy

Number of Acquired Spot Images: 0

PROCEDURE:
Informed consent was obtained from the patient prior to the
procedure, including potential complications of headache, allergy,
and pain. With the patient prone, the lower back was prepped with
Betadine. 1% Lidocaine was used for local anesthesia. Lumbar
puncture was performed at the L3-4 level using a 22 gauge needle
with return of clear CSF. 10 ml of CSF were obtained for laboratory
studies. The patient tolerated the procedure well and there were no
apparent complications.
IMPRESSION: Successful fluoroscopic guided lumbar puncture.

## 2021-06-13 ENCOUNTER — Other Ambulatory Visit: Payer: Self-pay

## 2021-06-13 ENCOUNTER — Non-Acute Institutional Stay (HOSPITAL_COMMUNITY)
Admission: RE | Admit: 2021-06-13 | Discharge: 2021-06-13 | Disposition: A | Discharge status: Home / Self Care | Payer: Medicaid Other | Source: Ambulatory Visit | Attending: Internal Medicine | Admitting: Internal Medicine

## 2021-06-13 DIAGNOSIS — G35 Multiple sclerosis: Secondary | ICD-10-CM | POA: Insufficient documentation

## 2021-06-13 MED ORDER — SODIUM CHLORIDE 0.9 % IV SOLN
300.0000 mg | Freq: Once | INTRAVENOUS | Status: AC
  Administered 2021-06-13: 300 mg via INTRAVENOUS
  Filled 2021-06-13: qty 15

## 2021-06-13 MED ORDER — SODIUM CHLORIDE 0.9 % IV SOLN
INTRAVENOUS | Status: DC | PRN
  Administered 2021-06-13: 250 mL via INTRAVENOUS

## 2021-06-13 NOTE — Progress Notes (Signed)
CENTER NOTE   Diagnosis: Multiple Sclerosis       Provider: Gurney Maxin MD     Procedure: Fritzi Mandes 300mg      Note: Patient received Tysabri infusion via PIV. No premeds required per orders. Tolerated infusion well with no adverse reaction. Vital signs stable. Pt declined AVS. Patient declined to stay for the 1 hour post-infusion observation. Pt instructed to schedule next infusion for 8 weeks, verbalized understanding. Alert, oriented and ambulatory at discharge.

## 2021-06-27 ENCOUNTER — Other Ambulatory Visit: Payer: Self-pay | Admitting: Obstetrics and Gynecology

## 2021-06-27 NOTE — Telephone Encounter (Signed)
Patient needs annual exam prior to next refill.  

## 2021-07-04 NOTE — Telephone Encounter (Signed)
Sent pt a Therapist, music and letter to address on file.

## 2021-08-08 ENCOUNTER — Other Ambulatory Visit: Payer: Self-pay

## 2021-08-08 ENCOUNTER — Non-Acute Institutional Stay (HOSPITAL_COMMUNITY)
Admission: RE | Admit: 2021-08-08 | Discharge: 2021-08-08 | Disposition: A | Discharge status: Home / Self Care | Payer: Medicaid Other | Source: Ambulatory Visit | Attending: Internal Medicine | Admitting: Internal Medicine

## 2021-08-08 DIAGNOSIS — G35 Multiple sclerosis: Secondary | ICD-10-CM | POA: Diagnosis present

## 2021-08-08 MED ORDER — SODIUM CHLORIDE 0.9 % IV SOLN
300.0000 mg | INTRAVENOUS | Status: DC
  Administered 2021-08-08: 300 mg via INTRAVENOUS
  Filled 2021-08-08: qty 15

## 2021-08-08 MED ORDER — SODIUM CHLORIDE 0.9 % IV SOLN: INTRAVENOUS | Status: DC | PRN

## 2021-08-08 NOTE — Progress Notes (Signed)
CENTER NOTE   Diagnosis: Multiple Sclerosis       Provider: Gurney Maxin MD     Procedure: Fritzi Mandes 300mg      Note: Patient received Tysabri infusion via PIV. No premeds required per orders. Pt expresses dizziness and lightheadeness x 3 weeks r/t inner ear infection. Dr. Melrose Nakayama aware, pt states she had an appointment with him today and instructed to continue Tysabri q8 weeks.Tolerated infusion well with no adverse reaction. Vital signs stable. Pt declined AVS. Patient declined to stay for the 1 hour post-infusion observation. Pt instructed to schedule next appointment prior to leaving, verbalized understanding. Alert, oriented and ambulatory at discharge.

## 2021-09-19 ENCOUNTER — Ambulatory Visit: Payer: Medicaid Other | Attending: Otolaryngology

## 2021-09-19 ENCOUNTER — Other Ambulatory Visit: Payer: Self-pay

## 2021-09-19 DIAGNOSIS — R42 Dizziness and giddiness: Secondary | ICD-10-CM | POA: Diagnosis present

## 2021-09-19 NOTE — Therapy (Signed)
[facility] [address] [place], [place], [address] Phone: [phone]   Fax:  [phone]  Physical Therapy Evaluation  Patient Details  Name: Meagan Powers MRN: [id number] Date of Birth: Jun 13, 1986 Referring Provider (PT): Dr. Kathyrn Sheriff   Encounter Date: 09/19/2021   PT End of Session - 09/20/21 0830     Visit Number 1    Number of Visits 9    Date for PT Re-Evaluation 11/14/21    Authorization Type eval: 09/19/21    PT Start Time 1405    PT Stop Time 1445    PT Time Calculation (min) 40 min    Activity Tolerance Patient tolerated treatment well    Behavior During Therapy WFL for tasks assessed/performed             Past Medical History:  Diagnosis Date   Abnormal uterine bleeding (AUB)    Acanthosis nigricans    AR (allergic rhinitis)    Cervical dysplasia    Dysmenorrhea    Dyspareunia in female    Incompetent cervix    Increased BMI    Migraine    Migraine    Pre-diabetes     Past Surgical History:  Procedure Laterality Date   CERVICAL BIOPSY  W/ LOOP ELECTRODE EXCISION     CERVICAL CERCLAGE     x 2   CHOLECYSTECTOMY     COLONOSCOPY WITH PROPOFOL N/A 10/15/2018   Procedure: COLONOSCOPY WITH PROPOFOL;  Surgeon: Virgel Manifold, MD;  Location: [facility] ENDOSCOPY;  Service: Endoscopy;  Laterality: N/A;   DILATION AND CURETTAGE OF UTERUS     HYSTEROSCOPY WITH D & C     LAPAROSCOPY      There were no vitals filed for this visit.    Subjective Assessment - 09/20/21 0827     Subjective Dizziness    Pertinent History Pt reports that the day after Thanksgiving between 12-1 she started feeling lightheadedness, nausea, and vomiting. She laid down and felt like the symptoms were worsening. Pt states that she felt very weak and when her eyes were open she was experiencing a shifting environment/vertigo. She was having to walk with her eyes closed due to the symptoms. Ultimately she went to [facility] but left because  the wait was too long. She ended up at [place] and was put on an antibiotic for a presumed left ear infection due to fluid being present on exam. She also has a history of migraines (3-4 last year, takes Imitrex prn, no prophylactic medications) and started with a migraine shortly after her symptoms began. Pt had a follow-up appointment with her PCP and was referred to ENT due to ongoing dizziness. She saw ENT who ordered a VNG study which showed a 31% L caloric weakness as well as abnormal smooth pursuits and saccades. Pt is followed by Dr. Melrose Nakayama with neurology for her MS which was diagnosed in 2021. Her initial MS symptoms included bilateral leg numbness and groin numbness. She gets infusions every 8 weeks and one cervical/brain MRI each year. Last cervical and brain MRI were 11/2021 which showed lesions in the cerebral white matter, brainstem, and upper cervical spinal cord without interval progression or evidence of active demyelination. Decreased size of a left frontal lesion. She notified Dr. Melrose Nakayama of her dizziness and saw him for a visit on 08/08/21 at which time she was started on nortriptyline. Pt works at a [employer] and has not been back to work since her symptoms started.  She reports that her work is very stressful but there were no added stressors at the time of her symptom onset.    Limitations Walking;Standing    Diagnostic tests VNG, Brain MRI (11/2020): see history    Patient Stated Goals Improve dizziness and return to work    Currently in Pain? Other (Comment)   Unrelated to current episode               VESTIBULAR AND BALANCE EVALUATION   HISTORY:  Subjective history of current problem: Pt reports that the day after Thanksgiving between 12-1 she started feeling lightheadedness, nausea, and vomiting. She laid down and felt like the symptoms were worsening. Pt states that she felt very weak and when her eyes were open she was experiencing a shifting  environment/vertigo. She was having to walk with her eyes closed due to the symptoms. Ultimately she went to [facility] but left because the wait was too long. She ended up at [place] and was put on an antibiotic for a presumed left ear infection due to fluid being present on exam. She also has a history of migraines (3-4 last year, takes Imitrex prn, no prophylactic medications) and started with a migraine shortly after her symptoms began. Pt had a follow-up appointment with her PCP and was referred to ENT due to ongoing dizziness. She saw ENT who ordered a VNG study which showed a 31% L caloric weakness as well as abnormal smooth pursuits and saccades. Pt is followed by Dr. Melrose Nakayama with neurology for her MS which was diagnosed in 2021. Her initial MS symptoms included bilateral leg numbness and groin numbness. She gets infusions every 8 weeks and one cervical/brain MRI each year. Last cervical and brain MRI were 11/2021 which showed lesions in the cerebral white matter, brainstem, and upper cervical spinal cord without interval progression or evidence of active demyelination. Decreased size of a left frontal lesion. She notified Dr. Melrose Nakayama of her dizziness and saw him for a visit on 08/08/21 at which time she was started on nortriptyline. Pt works at a [employer] and has not been back to work since her symptoms started. She reports that her work is very stressful but there were no added stressors at the time of her symptom onset.  Description of dizziness: vertigo, lightheadedness (vertigo, unsteadiness, lightheadedness, falling, general unsteadiness, whoozy, swimmy-headed sensation, aural fullness) Frequency: every other day Duration: "a few hours" Symptom nature: motion provoked, positional (motion provoked, positional, spontaneous, constant, variable, intermittent)   Provocative Factors: bending over, riding in the car (backseat), moving quick,  Easing Factors: lay down,   Progression of symptoms:  initially worse but better recently (better, worse, no change since onset) History of similar episodes: None  Falls (yes/no): None  Auditory complaints (tinnitus, pain, drainage, hearing loss, aural fullness): tinnitus bilaterally, stinging sensation bilaterally, pt feels "spasm on the L side" from the ear to the jaw, no aural fullness/hearing issues; Vision (diplopia, visual field loss, recent changes, last eye exam): None, wears glasses (saw optomotrist last year); Headaches/Migraines: History of migraines, pt had 3-4 migraines last year, Chest pain: None Anxiety/Stress: Stressful work, no change at symptom onset Red Flags: (dysarthria, dysphagia, drop attacks, bowel and bladder changes, recent weight loss/gain) Review of systems negative for red flags.    EXAMINATION  POSTURE: WNL  NEUROLOGICAL SCREEN: (2+ unless otherwise noted.) N=normal  Ab=abnormal  Level Dermatome R L Myotome R L Reflex R L  C3 Anterior Neck N N Sidebend C2-3 N N Jaw  CN V    C4 Top of Shoulder N N Shoulder Shrug C4 N N Hoffmans UMN    C5 Lateral Upper Arm N N Shoulder ABD C4-5 N N Biceps C5-6    C6 Lateral Arm/ Thumb N N Arm Flex/ Wrist Ext C5-6 N N Brachiorad. C5-6    C7 Middle Finger N N Arm Ext//Wrist Flex C6-7 N N Triceps C7    C8 4th & 5th Finger N N Flex/ Ext Carpi Ulnaris C8 N N Patellar (L3-4)    T1 Medial Arm N N Interossei T1 N N Gastrocnemius    L2 Medial thigh/groin N N Illiopsoas (L2-3) N N     L3 Lower thigh/med.knee N N Quadriceps (L3-4) N N     L4 Medial leg/lat thigh N N Tibialis Ant (L4-5) N N     L5 Lat. leg & dorsal foot N N EHL (L5) N N     S1 post/lat foot/thigh/leg N N Gastrocnemius (S1-2) N N     S2 Post./med. thigh & leg N N Hamstrings (L4-S3) N N       Cranial Nerves Visual acuity and visual fields are intact  Extraocular muscles are intact  Facial sensation is intact bilaterally  Facial strength is intact bilaterally  Hearing is normal as tested by gross conversation Unable  to visualize palate,  normal phonation  Shoulder shrug strength is intact  Tongue deviates to the right slightly   COORDINATION: Finger to Nose: Normal Heel to Shin: Normal Pronator Drift: Negative Rapid Alternating Movements: Normal Finger to Thumb Opposition: Normal  MUSCULOSKELETAL SCREEN: Cervical Spine ROM: Mild limitation in multiple directions however painless.    ROM: WNL, no focal weakness  MMT: WNL, no focal weakness  Functional Mobility: Independent for transfers and ambulation without assistive device   Gait: Deferred  Clinical Test of Sensory Interaction for Balance    (CTSIB): Deferred   OCULOMOTOR / VESTIBULAR TESTING:  Oculomotor Exam- Room Light  Findings Comments  Ocular Alignment normal   Ocular ROM normal Dizziness reported with vertical gaze  Spontaneous Nystagmus normal   Gaze-Holding Nystagmus normal   End-Gaze Nystagmus normal   Vergence (normal 2-3") not examined   Smooth Pursuit normal   Cross-Cover Test not examined   Saccades abnormal Consistently hypometric with corrective saccade required to reach target;  VOR Cancellation normal   Left Head Impulse Inconclusive Unable to assess due to repeated startle and blink  Right Head Impulse Inconclusive Unable to assess due to repeated startle and blink  Static Acuity not examined   Dynamic Acuity not examined     Oculomotor Exam- Fixation Suppressed: Deferred  BPPV TESTS:  Symptoms Duration Intensity Nystagmus  L Dix-Hallpike Mild dizziness   None  R Dix-Hallpike Mild dizziness   None  L Head Roll None   None  R Head Roll None   None  L Sidelying Test      R Sidelying Test        FUNCTIONAL OUTCOME MEASURES   09/19/21 Comments  BERG    DGI    FGA    TUG    5TSTS    10 Meter Gait Speed    FOTO 48 Predicted improvement to 59  ABC Scale    DHI    (If blank = not performed)             Objective measurements completed on examination: See above findings.  PT Education - 09/20/21 0911     Education Details Plan of care    Person(s) Educated Patient    Methods Explanation    Comprehension Verbalized understanding              PT Short Term Goals - 09/20/21 0925       PT SHORT TERM GOAL #1   Title Pt will be independent with HEP in order to improve dizziness in order to improve symptom-free function at home and work.    Time 4    Period Weeks    Status New    Target Date 10/17/21               PT Long Term Goals - 09/20/21 0925       PT LONG TERM GOAL #1   Title Pt will increase FOTO to at least 59 in order to demonstrate significant improvement in function related to dizziness    Baseline 09/19/21: 48    Time 8    Period Weeks    Status New    Target Date 11/14/21      PT LONG TERM GOAL #2   Title Pt will decrease DHI score by at least 18 points in order to demonstrate clinically significant reduction in disability related to dizziness    Baseline 09/19/21    Time 8    Period Weeks    Status New    Target Date 11/14/21      PT LONG TERM GOAL #3   Title Pt will report at least 75% improvement in dizziness symptoms in order to demonstrate ability to return to full function at home and work    Time 8    Period Weeks    Status New    Target Date 11/14/21                    Plan - 09/20/21 0831     Clinical Impression Statement Pt is a pleasant 36 year-old female 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 PT examination is mostly normal however pt does have consistently hypometric horizontal saccades during testing requiring 1 correction to hit target. Unable to assess head impulse testing as pt consistently startles/blinks during testing. All BPPV testing is negative during this visit. Additional balance testing and fixation suppression testing deferred to next session. Given patient's  description of symptoms lasting "multiple hours" when they occur and history of MS there is some concern for central contribution to symptoms. She was found to have abnormal smooth pursuits and saccades on VNG testing. Her last MRI in April of 2022 did not show any new lesions but she still has a small T2 hyperintense lesion in the far right lateral aspect of the pons near the anterior aspect of the middle cerebellar peduncle. She is currently followed by Dr. Melrose Nakayama and was last seen in December of 2022 at which time she was started on nortriptyline. Pt will benefit from skilled PT services to decrease dizziness and improve function at home as well as return to work.    Personal Factors and Comorbidities Comorbidity 2    Comorbidities Migraine, MS    Examination-Activity Limitations [place];Caring for Others;Stand;Transfers    Examination-Participation Restrictions Community Activity;Occupation    Stability/Clinical Decision Making Evolving/Moderate complexity    Clinical Decision Making Moderate    Rehab Potential Good    PT Frequency 1x / week    PT  Duration 8 weeks    PT Treatment/Interventions Canalith Repostioning;Cryotherapy;Electrical Stimulation;Iontophoresis 4mg /ml Dexamethasone;Moist Heat;Traction;Ultrasound;DME Instruction;Gait training;Functional mobility training;Therapeutic activities;Therapeutic exercise;Balance training;Neuromuscular re-education;Patient/family education;Manual techniques;Passive range of motion;Dry needling;Energy conservation;Vestibular;Spinal Manipulations;Joint Manipulations    PT Next Visit Plan Initiate gaze stabilization exercises, fixation suppression oculomotor/vestibular testing, BERG, FGA, DHI, ABC, issue HEP    PT Home Exercise Plan None currently    Consulted and Agree with Plan of Care Patient             Patient will benefit from skilled therapeutic intervention in order to improve the following deficits and impairments:  Dizziness  Visit  Diagnosis: Dizziness and giddiness     Problem List Patient Active Problem List   Diagnosis Date Noted   Blood per rectum    Anal fissure    Abnormal uterine bleeding (AUB) 10/25/2015   Endometrial polyp 10/25/2015   Acanthosis nigricans 10/25/2015   Allergic rhinitis 10/25/2015   Moderate cervical dysplasia 10/25/2015   Prediabetes 10/25/2015   Dyspareunia, female 10/25/2015   Dysmenorrhea 10/25/2015   Increased BMI 10/25/2015   Phillips Grout PT, DPT, GCS  Umer Harig, PT 09/20/2021, 2:30 PM  [facility] [address] [place], [place], [address] Phone: [phone]   Fax:  [phone]  Name: Meagan Powers MRN: [id number] Date of Birth: Dec 31, 1985

## 2021-09-21 ENCOUNTER — Ambulatory Visit: Payer: Medicaid Other

## 2021-09-25 ENCOUNTER — Ambulatory Visit: Payer: Medicaid Other

## 2021-09-25 ENCOUNTER — Other Ambulatory Visit: Payer: Self-pay

## 2021-09-25 DIAGNOSIS — R42 Dizziness and giddiness: Secondary | ICD-10-CM

## 2021-09-25 NOTE — Therapy (Signed)
[facility] [address] [place], [place], [address] Phone: [phone]   Fax:  [phone]  Physical Therapy Treatment  Patient Details  Name: Meagan Powers MRN: [id number] Date of Birth: 29-Apr-1986 Referring Provider (PT): Dr. Kathyrn Sheriff   Encounter Date: 09/25/2021   PT End of Session - 09/25/21 1314     Visit Number 2    Number of Visits 9    Date for PT Re-Evaluation 11/14/21    Authorization Type eval: 09/19/21    PT Start Time 1315    PT Stop Time 1400    PT Time Calculation (min) 45 min    Activity Tolerance Patient tolerated treatment well    Behavior During Therapy WFL for tasks assessed/performed             Past Medical History:  Diagnosis Date   Abnormal uterine bleeding (AUB)    Acanthosis nigricans    AR (allergic rhinitis)    Cervical dysplasia    Dysmenorrhea    Dyspareunia in female    Incompetent cervix    Increased BMI    Migraine    Migraine    Pre-diabetes     Past Surgical History:  Procedure Laterality Date   CERVICAL BIOPSY  W/ LOOP ELECTRODE EXCISION     CERVICAL CERCLAGE     x 2   CHOLECYSTECTOMY     COLONOSCOPY WITH PROPOFOL N/A 10/15/2018   Procedure: COLONOSCOPY WITH PROPOFOL;  Surgeon: Virgel Manifold, MD;  Location: [facility] ENDOSCOPY;  Service: Endoscopy;  Laterality: N/A;   DILATION AND CURETTAGE OF UTERUS     HYSTEROSCOPY WITH D & C     LAPAROSCOPY      There were no vitals filed for this visit.   Subjective Assessment - 09/25/21 1314     Subjective Pt reports that she is doing well today. She denies any episodes of dizziness since her initial evaluation. No specific questions or concerns currently.    Pertinent History Pt reports that the day after Thanksgiving between 12-1 she started feeling lightheadedness, nausea, and vomiting. She laid down and felt like the symptoms were worsening. Pt states that she felt very weak and when her eyes were open she was experiencing a  shifting environment/vertigo. She was having to walk with her eyes closed due to the symptoms. Ultimately she went to [facility] but left because the wait was too long. She ended up at [facility] and was put on an antibiotic for a presumed left ear infection due to fluid being present on exam. She also has a history of migraines (3-4 last year, takes Imitrex prn, no prophylactic medications) and started with a migraine shortly after her symptoms began. Pt had a follow-up appointment with her PCP and was referred to ENT due to ongoing dizziness. She saw ENT who ordered a VNG study which showed a 31% L caloric weakness as well as abnormal smooth pursuits and saccades. Pt is followed by Dr. Melrose Nakayama with neurology for her MS which was diagnosed in 2021. Her initial MS symptoms included bilateral leg numbness and groin numbness. She gets infusions every 8 weeks and one cervical/brain MRI each year. Last cervical and brain MRI were 11/2021 which showed lesions in the cerebral white matter, brainstem, and upper cervical spinal cord without interval progression or evidence of active demyelination. Decreased size of a left frontal lesion. She notified Dr. Melrose Nakayama of her dizziness and saw him for a visit on 08/08/21 at which time she  was started on nortriptyline. Pt works at a [employer] and has not been back to work since her symptoms started. She reports that her work is very stressful but there were no added stressors at the time of her symptom onset.    Limitations Walking;Standing    Diagnostic tests VNG, Brain MRI (11/2020): see history    Patient Stated Goals Improve dizziness and return to work    Currently in Pain? Other (Comment)   Unrelated to current episode               [facility] PT Assessment - 09/25/21 1352       Standardized Balance Assessment   Standardized Balance Assessment Berg Balance Test      Berg Balance Test   Sit to Stand Able to stand without using hands and stabilize independently     Standing Unsupported Able to stand safely 2 minutes    Sitting with Back Unsupported but Feet Supported on Floor or Stool Able to sit safely and securely 2 minutes    Stand to Sit Sits safely with minimal use of hands    Transfers Able to transfer safely, minor use of hands    Standing Unsupported with Eyes Closed Able to stand 10 seconds safely    Standing Unsupported with Feet Together Able to place feet together independently and stand 1 minute safely    From Standing, Reach Forward with Outstretched Arm Can reach forward >12 cm safely (5")    From Standing Position, Pick up Object from Floor Able to pick up shoe safely and easily    From Standing Position, Turn to Look Behind Over each Shoulder Looks behind from both sides and weight shifts well    Turn 360 Degrees Able to turn 360 degrees safely in 4 seconds or less    Standing Unsupported, Alternately Place Feet on Step/Stool Able to stand independently and safely and complete 8 steps in 20 seconds    Standing Unsupported, One Foot in Front Able to place foot tandem independently and hold 30 seconds    Standing on One Leg Able to lift leg independently and hold > 10 seconds    Total Score 55      Functional Gait  Assessment   Gait assessed  Yes    Gait Level Surface Walks 20 ft in less than 5.5 sec, no assistive devices, good speed, no evidence for imbalance, normal gait pattern, deviates no more than 6 in outside of the 12 in walkway width.    Change in Gait Speed Able to smoothly change walking speed without loss of balance or gait deviation. Deviate no more than 6 in outside of the 12 in walkway width.    Gait with Horizontal Head Turns Performs head turns smoothly with slight change in gait velocity (eg, minor disruption to smooth gait path), deviates 6-10 in outside 12 in walkway width, or uses an assistive device.    Gait with Vertical Head Turns Performs task with slight change in gait velocity (eg, minor disruption to smooth gait  path), deviates 6 - 10 in outside 12 in walkway width or uses assistive device    Gait and Pivot Turn Pivot turns safely within 3 sec and stops quickly with no loss of balance.    Step Over Obstacle Is able to step over 2 stacked shoe boxes taped together (9 in total height) without changing gait speed. No evidence of imbalance.    Gait with Narrow Base of Support Is able  to ambulate for 10 steps heel to toe with no staggering.    Gait with Eyes Closed Walks 20 ft, uses assistive device, slower speed, mild gait deviations, deviates 6-10 in outside 12 in walkway width. Ambulates 20 ft in less than 9 sec but greater than 7 sec.    Ambulating Backwards Walks 20 ft, uses assistive device, slower speed, mild gait deviations, deviates 6-10 in outside 12 in walkway width.    Steps Alternating feet, must use rail.    Total Score 25               TREATMENT   Neuromuscular Re-education  Updated additional outcome measures with patient: ABC: 61.3% DHI: 60/100 BERG: 55/56 FGA: 25/30 DVA: static: 20/15, dynamic: 20/70 (6 line loss);   Oculomotor Exam- Fixation Suppressed  Findings Comments  Ocular Alignment abnormal R eye consistently drifts inward (esotropia)  Spontaneous Nystagmus normal   Gaze-Holding Nystagmus normal   End-Gaze Nystagmus normal   Head Shaking Nystagmus normal   Pressure-Induced Nystagmus not examined   Hyperventilation Induced Nystagmus not examined   Skull Vibration Induced Nystagmus not examined     VOR x 1 horizontal in sitting with target at arms length on plain wall x 60s (8.5/10 dizziness); VOR x 1 horizontal in sitting with target at arms length on plain wall 2 x 30s (5/10 dizziness); HEP issued;   Updated additional outcome measures with patient during session. She scored 55/56 on the BERG demonstrating good static balance with some dynamic balance deficits noted on the FGA (25/30). Her ABC of 61.3% was below the cut-off and her DHI of 60/100 indicated  moderate perception of disability related to her dizziness. Positive DVA test with 6 line loss. Initiated gaze stabilization exercises with patient however 60s is too long so reps limited to 30s. 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 Pt denies prior history of strabismus. Esotropia not apparent in room light or with cross-cover test. HEP issued and pt encouraged to follow-up as scheduled. Pt will benefit from PT services to address deficits in dizziness and balance in order to return to full function at home and work with less symptoms.                      PT Short Term Goals - 09/20/21 0925       PT SHORT TERM GOAL #1   Title Pt will be independent with HEP in order to improve dizziness in order to improve symptom-free function at home and work.    Time 4    Period Weeks    Status New    Target Date 10/17/21               PT Long Term Goals - 09/25/21 1531       PT LONG TERM GOAL #1   Title Pt will increase FOTO to at least 59 in order to demonstrate significant improvement in function related to dizziness    Baseline 09/19/21: 48    Time 8    Period Weeks    Status New    Target Date 11/14/21      PT LONG TERM GOAL #2   Title Pt will decrease DHI score by at least 18 points in order to demonstrate clinically significant reduction in disability related to dizziness    Baseline 09/25/21: 60/100    Time 8    Period Weeks    Status New    Target Date  11/14/21      PT LONG TERM GOAL #3   Title Pt will report at least 75% improvement in dizziness symptoms in order to demonstrate ability to return to full function at home and work    Time 8    Period Weeks    Status New    Target Date 11/14/21                   Plan - 09/25/21 1314     Clinical Impression Statement Updated additional outcome measures with patient during session. She scored 55/56 on the BERG demonstrating good static balance with some  dynamic balance deficits noted on the FGA (25/30). Her ABC of 61.3% was below the cut-off and her DHI of 60/100 indicated moderate perception of disability related to her dizziness. Positive DVA test with 6 line loss. Initiated gaze stabilization exercises with patient however 60s is too long so reps limited to 30s. 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 Pt denies prior history of strabismus. Esotropia not apparent in room light or with cross-cover test. HEP issued and pt encouraged to follow-up as scheduled. Pt will benefit from PT services to address deficits in dizziness and balance in order to return to full function at home and work with less symptoms.    Personal Factors and Comorbidities Comorbidity 2    Comorbidities Migraine, MS    Examination-Activity Limitations Bend;Caring for Others;Stand;Transfers    Examination-Participation Restrictions Community Activity;Occupation    Stability/Clinical Decision Making Evolving/Moderate complexity    Rehab Potential Good    PT Frequency 1x / week    PT Duration 8 weeks    PT Treatment/Interventions Canalith Repostioning;Cryotherapy;Electrical Stimulation;Iontophoresis 4mg /ml Dexamethasone;Moist Heat;Traction;Ultrasound;DME Instruction;Gait training;Functional mobility training;Therapeutic activities;Therapeutic exercise;Balance training;Neuromuscular re-education;Patient/family education;Manual techniques;Passive range of motion;Dry needling;Energy conservation;Vestibular;Spinal Manipulations;Joint Manipulations    PT Next Visit Plan Review HEP, progress gaze stabilization and habituation exercises    PT Home Exercise Plan Access Code: [email or web address]    [email or web address] and Agree with Plan of Care Patient             Patient will benefit from skilled therapeutic intervention in order to improve the following deficits and impairments:  Dizziness  Visit Diagnosis: Dizziness and giddiness     Problem  List Patient Active Problem List   Diagnosis Date Noted   Blood per rectum    Anal fissure    Abnormal uterine bleeding (AUB) 10/25/2015   Endometrial polyp 10/25/2015   Acanthosis nigricans 10/25/2015   Allergic rhinitis 10/25/2015   Moderate cervical dysplasia 10/25/2015   Prediabetes 10/25/2015   Dyspareunia, female 10/25/2015   Dysmenorrhea 10/25/2015   Increased BMI 10/25/2015   Phillips Grout PT, DPT, GCS  Brandn Mcgath, PT 09/25/2021, 3:34 PM  [facility] [address] [place], [place], [address] Phone: [phone]   Fax:  [phone]  Name: Meagan Powers MRN: [id number] Date of Birth: [birth year]

## 2021-09-25 NOTE — Patient Instructions (Signed)
Access Code: [email or web address] URL: [email or web address] Date: 09/25/2021 Prepared by: Roxana Hires  Exercises Seated Gaze Stabilization with Head Rotation - 4 x daily - 7 x weekly - 3 reps - 30 seconds hold

## 2021-09-27 ENCOUNTER — Ambulatory Visit: Payer: Medicaid Other

## 2021-10-02 ENCOUNTER — Ambulatory Visit: Payer: Medicaid Other | Attending: Otolaryngology

## 2021-10-02 ENCOUNTER — Other Ambulatory Visit: Payer: Self-pay

## 2021-10-02 DIAGNOSIS — R42 Dizziness and giddiness: Secondary | ICD-10-CM | POA: Diagnosis not present

## 2021-10-02 NOTE — Therapy (Signed)
[facility] [address] [place], [place], [address] Phone: [phone]   Fax:  [phone]  Physical Therapy Treatment  Patient Details  Name: Meagan Powers MRN: [id number] Date of Birth: 19-Jan-1986 Referring Provider (PT): Dr. Kathyrn Sheriff   Encounter Date: 10/02/2021   PT End of Session - 10/03/21 0918     Visit Number 3    Number of Visits 9    Date for PT Re-Evaluation 11/14/21    Authorization Type eval: 09/19/21    PT Start Time 1315    PT Stop Time 1400    PT Time Calculation (min) 45 min    Activity Tolerance Patient tolerated treatment well    Behavior During Therapy WFL for tasks assessed/performed              Past Medical History:  Diagnosis Date   Abnormal uterine bleeding (AUB)    Acanthosis nigricans    AR (allergic rhinitis)    Cervical dysplasia    Dysmenorrhea    Dyspareunia in female    Incompetent cervix    Increased BMI    Migraine    Migraine    Pre-diabetes     Past Surgical History:  Procedure Laterality Date   CERVICAL BIOPSY  W/ LOOP ELECTRODE EXCISION     CERVICAL CERCLAGE     x 2   CHOLECYSTECTOMY     COLONOSCOPY WITH PROPOFOL N/A 10/15/2018   Procedure: COLONOSCOPY WITH PROPOFOL;  Surgeon: Virgel Manifold, MD;  Location: [facility] ENDOSCOPY;  Service: Endoscopy;  Laterality: N/A;   DILATION AND CURETTAGE OF UTERUS     HYSTEROSCOPY WITH D & C     LAPAROSCOPY      There were no vitals filed for this visit.   Subjective Assessment - 10/03/21 0915     Subjective Pt reports that she is doing well today. No significant changes since the last therapy session. No specific questions or concerns currently.    Pertinent History Pt reports that the day after Thanksgiving between 12-1 she started feeling lightheadedness, nausea, and vomiting. She laid down and felt like the symptoms were worsening. Pt states that she felt very weak and when her eyes were open she was experiencing a shifting  environment/vertigo. She was having to walk with her eyes closed due to the symptoms. Ultimately she went to [facility] but left because the wait was too long. She ended up at [facility] and was put on an antibiotic for a presumed left ear infection due to fluid being present on exam. She also has a history of migraines (3-4 last year, takes Imitrex prn, no prophylactic medications) and started with a migraine shortly after her symptoms began. Pt had a follow-up appointment with her PCP and was referred to ENT due to ongoing dizziness. She saw ENT who ordered a VNG study which showed a 31% L caloric weakness as well as abnormal smooth pursuits and saccades. Pt is followed by Dr. Melrose Nakayama with neurology for her MS which was diagnosed in 2021. Her initial MS symptoms included bilateral leg numbness and groin numbness. She gets infusions every 8 weeks and one cervical/brain MRI each year. Last cervical and brain MRI were 11/2021 which showed lesions in the cerebral white matter, brainstem, and upper cervical spinal cord without interval progression or evidence of active demyelination. Decreased size of a left frontal lesion. She notified Dr. Melrose Nakayama of her dizziness and saw him for a visit on 08/08/21 at which time she was  started on nortriptyline. Pt works at a [employer] and has not been back to work since her symptoms started. She reports that her work is very stressful but there were no added stressors at the time of her symptom onset.    Limitations Walking;Standing    Diagnostic tests VNG, Brain MRI (11/2020): see history    Patient Stated Goals Improve dizziness and return to work    Currently in Pain? Other (Comment)   Unrelated to current episode                    TREATMENT   Neuromuscular Re-education  VOR x 1 horizontal in sitting with target at arms length on plain wall x 30s (5-6/10 dizziness); VOR x 1 horizontal in sitting with target at arms length on plain wall x 60s (5-6/10  dizziness); VOR x 1 horizontal in standing with target at arms length on plain wall 2 x 30s (5-6/10 dizziness); Gait in hallway with ball pass between hands with head/eye follow x 75'; Gait in hallway with vertical ball lift with head/eye follow x 75', pt with mild slowing of gait; Gait in hallway with horizontal ball tosses with therapist with head/eye follow 2 x 75' toward each side; Gait in hallway with horizontal ball bounce with therapist with head/eye follow x 75' toward each side; Feet together on Airex pad with horizontal and vertical head turns x 30s each; Airex balance eyes closed x 30s; Half tandem balance alternating forward LE x 30s with each foot forward; HEP updated and reviewed with patient;   Pt will benefit from PT services to address deficits in dizziness and balance in order to return to full function at home and work with less symptoms.    Pt demonstrates excellent motivation during session today. Progressed gaze stabilization exercises with patient to standing but limited duration to 30s. Pt instructed to progress to 60s as tolerated at home if it does not increase the intensity of her dizziness.  Introduced habituation exercises including ball tosses/passes during ambulation in the hallway. HEP updated and pt encouraged to follow-up as scheduled. Pt will benefit from PT services to address deficits in dizziness and balance in order to return to full function at home and work with less symptoms.             PT Education - 10/03/21 0918     Education Details Updated HEP    Person(s) Educated Patient    Methods Explanation;Demonstration    Comprehension Verbalized understanding;Returned demonstration              PT Short Term Goals - 09/20/21 0925       PT SHORT TERM GOAL #1   Title Pt will be independent with HEP in order to improve dizziness in order to improve symptom-free function at home and work.    Time 4    Period Weeks    Status New     Target Date 10/17/21               PT Long Term Goals - 09/25/21 1531       PT LONG TERM GOAL #1   Title Pt will increase FOTO to at least 59 in order to demonstrate significant improvement in function related to dizziness    Baseline 09/19/21: 48    Time 8    Period Weeks    Status New    Target Date 11/14/21      PT LONG TERM GOAL #2  Title Pt will decrease DHI score by at least 18 points in order to demonstrate clinically significant reduction in disability related to dizziness    Baseline 09/25/21: 60/100    Time 8    Period Weeks    Status New    Target Date 11/14/21      PT LONG TERM GOAL #3   Title Pt will report at least 75% improvement in dizziness symptoms in order to demonstrate ability to return to full function at home and work    Time 8    Period Weeks    Status New    Target Date 11/14/21                   Plan - 10/02/21 1322     Clinical Impression Statement ?Pt demonstrates excellent motivation during session today. Progressed gaze stabilization exercises with patient to standing but limited duration to 30s. Pt instructed to progress to 60s as tolerated at home if it does not increase the intensity of her dizziness.  Introduced habituation exercises including ball tosses/passes during ambulation in the hallway. HEP updated and pt encouraged to follow-up as scheduled. Pt will benefit from PT services to address deficits in dizziness and balance in order to return to full function at home and work with less symptoms.    Personal Factors and Comorbidities Comorbidity 2    Comorbidities Migraine, MS    Examination-Activity Limitations [place];Caring for Others;Stand;Transfers    Examination-Participation Restrictions Community Activity;Occupation    Stability/Clinical Decision Making Evolving/Moderate complexity    Rehab Potential Good    PT Frequency 1x / week    PT Duration 8 weeks    PT Treatment/Interventions Canalith  Repostioning;Cryotherapy;Electrical Stimulation;Iontophoresis 4mg /ml Dexamethasone;Moist Heat;Traction;Ultrasound;DME Instruction;Gait training;Functional mobility training;Therapeutic activities;Therapeutic exercise;Balance training;Neuromuscular re-education;Patient/family education;Manual techniques;Passive range of motion;Dry needling;Energy conservation;Vestibular;Spinal Manipulations;Joint Manipulations    PT Next Visit Plan Review HEP, progress gaze stabilization and habituation exercises    PT Home Exercise Plan Access Code: [email or web address]    [email or web address] and Agree with Plan of Care Patient              Patient will benefit from skilled therapeutic intervention in order to improve the following deficits and impairments:  Dizziness  Visit Diagnosis: Dizziness and giddiness     Problem List Patient Active Problem List   Diagnosis Date Noted   Blood per rectum    Anal fissure    Abnormal uterine bleeding (AUB) 10/25/2015   Endometrial polyp 10/25/2015   Acanthosis nigricans 10/25/2015   Allergic rhinitis 10/25/2015   Moderate cervical dysplasia 10/25/2015   Prediabetes 10/25/2015   Dyspareunia, female 10/25/2015   Dysmenorrhea 10/25/2015   Increased BMI 10/25/2015   Phillips Grout PT, DPT, GCS  Morghan Kester, PT 10/03/2021, 9:28 AM  [facility] [address] [place], [place], [address] Phone: [phone]   Fax:  [phone]  Name: SHADANA PRY MRN: [id number] Date of Birth: 14-Jul-1986

## 2021-10-02 NOTE — Patient Instructions (Signed)
Access Code: [email or web address] URL: [email or web address] Date: 10/02/2021 Prepared by: Roxana Hires  Exercises Standing Gaze Stabilization with Head Rotation - 4 x daily - 7 x weekly - 3 reps - 30 seconds hold Half Tandem Stance Balance with Head Rotation - 2 x daily - 7 x weekly - 2 x 30s with each foot forward hold

## 2021-10-04 ENCOUNTER — Other Ambulatory Visit: Payer: Self-pay | Admitting: Obstetrics and Gynecology

## 2021-10-04 ENCOUNTER — Ambulatory Visit: Payer: Medicaid Other

## 2021-10-09 ENCOUNTER — Other Ambulatory Visit: Payer: Self-pay

## 2021-10-09 ENCOUNTER — Ambulatory Visit: Payer: Medicaid Other

## 2021-10-09 DIAGNOSIS — R42 Dizziness and giddiness: Secondary | ICD-10-CM | POA: Diagnosis not present

## 2021-10-09 NOTE — Therapy (Signed)
[facility] [address] [place], [place], [address] Phone: [phone]   Fax:  [phone]  Physical Therapy Treatment  Patient Details  Name: Meagan Powers MRN: [id number] Date of Birth: 02/03/1986 Referring Provider (PT): Dr. Kathyrn Sheriff   Encounter Date: 10/09/2021   PT End of Session - 10/09/21 1314     Visit Number 4    Number of Visits 9    Date for PT Re-Evaluation 11/14/21    Authorization Type eval: 09/19/21    PT Start Time 1315    PT Stop Time 1400    PT Time Calculation (min) 45 min    Activity Tolerance Patient tolerated treatment well    Behavior During Therapy WFL for tasks assessed/performed              Past Medical History:  Diagnosis Date   Abnormal uterine bleeding (AUB)    Acanthosis nigricans    AR (allergic rhinitis)    Cervical dysplasia    Dysmenorrhea    Dyspareunia in female    Incompetent cervix    Increased BMI    Migraine    Migraine    Pre-diabetes     Past Surgical History:  Procedure Laterality Date   CERVICAL BIOPSY  W/ LOOP ELECTRODE EXCISION     CERVICAL CERCLAGE     x 2   CHOLECYSTECTOMY     COLONOSCOPY WITH PROPOFOL N/A 10/15/2018   Procedure: COLONOSCOPY WITH PROPOFOL;  Surgeon: Virgel Manifold, MD;  Location: [facility] ENDOSCOPY;  Service: Endoscopy;  Laterality: N/A;   DILATION AND CURETTAGE OF UTERUS     HYSTEROSCOPY WITH D & C     LAPAROSCOPY      There were no vitals filed for this visit.   Subjective Assessment - 10/09/21 1314     Subjective Pt reports that she is doing well today. No significant changes since the last therapy session. Overall symptoms have improved since the initial evaluation. No specific questions or concerns currently.    Pertinent History Pt reports that the day after Thanksgiving between 12-1 she started feeling lightheadedness, nausea, and vomiting. She laid down and felt like the symptoms were worsening. Pt states that she felt very weak  and when her eyes were open she was experiencing a shifting environment/vertigo. She was having to walk with her eyes closed due to the symptoms. Ultimately she went to [facility] but left because the wait was too long. She ended up at [place] and was put on an antibiotic for a presumed left ear infection due to fluid being present on exam. She also has a history of migraines (3-4 last year, takes Imitrex prn, no prophylactic medications) and started with a migraine shortly after her symptoms began. Pt had a follow-up appointment with her PCP and was referred to ENT due to ongoing dizziness. She saw ENT who ordered a VNG study which showed a 31% L caloric weakness as well as abnormal smooth pursuits and saccades. Pt is followed by Dr. Melrose Nakayama with neurology for her MS which was diagnosed in 2021. Her initial MS symptoms included bilateral leg numbness and groin numbness. She gets infusions every 8 weeks and one cervical/brain MRI each year. Last cervical and brain MRI were 11/2021 which showed lesions in the cerebral white matter, brainstem, and upper cervical spinal cord without interval progression or evidence of active demyelination. Decreased size of a left frontal lesion. She notified Dr. Melrose Nakayama of her dizziness and saw him for a  visit on 08/08/21 at which time she was started on nortriptyline. Pt works at a [employer] and has not been back to work since her symptoms started. She reports that her work is very stressful but there were no added stressors at the time of her symptom onset.    Limitations Walking;Standing    Diagnostic tests VNG, Brain MRI (11/2020): see history    Patient Stated Goals Improve dizziness and return to work    Currently in Pain? Other (Comment)   Unrelated to current episode                TREATMENT   Neuromuscular Re-education  VOR x 1 horizontal in standing with target at arms length on plain wall feet together x 30s (4-5/10 dizziness); VOR x 1 horizontal in  standing on Airex with feet together and target at arms length on plain wall x 30s (4-5/10 dizziness); VOR x 1 horizontal in standing on Airex with feet together and target at arms length on plain wall 2 x 60s (4-5/10 dizziness); Standing on Airex pad with feet apart bending forward to pick up ball from basket on stool, turn 180 degrees, and drop the balls into another basket x 2 minutes toward each side; Forward and backward gait in hallway with vertical ball lift with head/eye follow x 75', pt with mild slowing of gait; Forward and backward gait in hallway with horizontal ball tosses with therapist with head/eye follow x 73' toward each side; Forward and backward gait in hallway with horizontal ball bounce with therapist with head/eye follow x 75' toward each side; HEP updated and reviewed with patient;   Pt will benefit from PT services to address deficits in dizziness and balance in order to return to full function at home and work with less symptoms.    Pt demonstrates excellent motivation during session today. Progressed gaze stabilization exercises with patient to standing on Airex pad with feet together. Pt reports some dizziness since the last therapy session with forward bending so practiced forward bending on Airex pad to pick up balls and transfer them Repeated habituation exercises including ball tosses/passes during ambulation in the hallway. HEP updated and pt encouraged to follow-up as scheduled. Pt will benefit from PT services to address deficits in dizziness and balance in order to return to full function at home and work with less symptoms.                PT Short Term Goals - 09/20/21 0925       PT SHORT TERM GOAL #1   Title Pt will be independent with HEP in order to improve dizziness in order to improve symptom-free function at home and work.    Time 4    Period Weeks    Status New    Target Date 10/17/21               PT Long Term Goals - 09/25/21  1531       PT LONG TERM GOAL #1   Title Pt will increase FOTO to at least 59 in order to demonstrate significant improvement in function related to dizziness    Baseline 09/19/21: 48    Time 8    Period Weeks    Status New    Target Date 11/14/21      PT LONG TERM GOAL #2   Title Pt will decrease DHI score by at least 18 points in order to demonstrate clinically significant reduction in disability related  to dizziness    Baseline 09/25/21: 60/100    Time 8    Period Weeks    Status New    Target Date 11/14/21      PT LONG TERM GOAL #3   Title Pt will report at least 75% improvement in dizziness symptoms in order to demonstrate ability to return to full function at home and work    Time 8    Period Weeks    Status New    Target Date 11/14/21                   Plan - 10/09/21 1314     Clinical Impression Statement Pt demonstrates excellent motivation during session today. Progressed gaze stabilization exercises with patient to standing on Airex pad with feet together. Pt reports some dizziness since the last therapy session with forward bending so practiced forward bending on Airex pad to pick up balls and transfer them Repeated habituation exercises including ball tosses/passes during ambulation in the hallway. HEP updated and pt encouraged to follow-up as scheduled. Pt will benefit from PT services to address deficits in dizziness and balance in order to return to full function at home and work with less symptoms.    Personal Factors and Comorbidities Comorbidity 2    Comorbidities Migraine, MS    Examination-Activity Limitations Bend;Caring for Others;Stand;Transfers    Examination-Participation Restrictions Community Activity;Occupation    Stability/Clinical Decision Making Evolving/Moderate complexity    Rehab Potential Good    PT Frequency 1x / week    PT Duration 8 weeks    PT Treatment/Interventions Canalith Repostioning;Cryotherapy;Electrical  Stimulation;Iontophoresis 4mg /ml Dexamethasone;Moist Heat;Traction;Ultrasound;DME Instruction;Gait training;Functional mobility training;Therapeutic activities;Therapeutic exercise;Balance training;Neuromuscular re-education;Patient/family education;Manual techniques;Passive range of motion;Dry needling;Energy conservation;Vestibular;Spinal Manipulations;Joint Manipulations    PT Next Visit Plan Review HEP, progress gaze stabilization and habituation exercises    PT Home Exercise Plan Access Code: [email or web address]    [id number] and Agree with Plan of Care Patient              Patient will benefit from skilled therapeutic intervention in order to improve the following deficits and impairments:  Dizziness  Visit Diagnosis: Dizziness and giddiness     Problem List Patient Active Problem List   Diagnosis Date Noted   Blood per rectum    Anal fissure    Abnormal uterine bleeding (AUB) 10/25/2015   Endometrial polyp 10/25/2015   Acanthosis nigricans 10/25/2015   Allergic rhinitis 10/25/2015   Moderate cervical dysplasia 10/25/2015   Prediabetes 10/25/2015   Dyspareunia, female 10/25/2015   Dysmenorrhea 10/25/2015   Increased BMI 10/25/2015   Phillips Grout PT, DPT, GCS  Fabyan Loughmiller, PT 10/09/2021, 2:36 PM  [facility] [address] [place], [place], [address] Phone: [phone]   Fax:  [phone]  Name: Meagan Powers MRN: [id number] Date of Birth: 1986-02-07

## 2021-10-11 ENCOUNTER — Other Ambulatory Visit: Payer: Self-pay

## 2021-10-11 ENCOUNTER — Non-Acute Institutional Stay (HOSPITAL_COMMUNITY)
Admission: RE | Admit: 2021-10-11 | Discharge: 2021-10-11 | Disposition: A | Discharge status: Home / Self Care | Payer: Medicaid Other | Source: Ambulatory Visit | Attending: Internal Medicine | Admitting: Internal Medicine

## 2021-10-11 DIAGNOSIS — G35 Multiple sclerosis: Secondary | ICD-10-CM | POA: Diagnosis present

## 2021-10-11 MED ORDER — SODIUM CHLORIDE 0.9 % IV SOLN: INTRAVENOUS | Status: DC | PRN

## 2021-10-11 MED ORDER — SODIUM CHLORIDE 0.9 % IV SOLN
300.0000 mg | INTRAVENOUS | Status: DC
  Administered 2021-10-11: 300 mg via INTRAVENOUS
  Filled 2021-10-11: qty 15

## 2021-10-11 NOTE — Progress Notes (Addendum)
PATIENT CARE CENTER NOTE   Diagnosis: Multiple Sclerosis       Provider:  Gurney Maxin MD     Procedure: Fritzi Mandes 300mg      Note: Patient received Tysabri infusion via PIV. No premeds required per orders. Tolerated infusion well with no adverse reaction. Vital signs stable. Pt declined AVS.  Patient declined to stay for the 1 hour post-infusion observation. Patient receives infusion Q8 weeks, instructed pt to schedule next appointment at front desk prior to leaving, verbalized understanding. Alert, oriented and ambulatory at discharge.

## 2021-10-12 ENCOUNTER — Ambulatory Visit: Payer: Medicaid Other

## 2021-10-16 ENCOUNTER — Ambulatory Visit: Payer: Medicaid Other

## 2021-10-18 ENCOUNTER — Ambulatory Visit: Payer: Medicaid Other

## 2021-10-23 ENCOUNTER — Ambulatory Visit: Payer: Medicaid Other

## 2021-10-23 ENCOUNTER — Other Ambulatory Visit: Payer: Self-pay

## 2021-10-23 DIAGNOSIS — R42 Dizziness and giddiness: Secondary | ICD-10-CM

## 2021-10-23 NOTE — Therapy (Signed)
[facility] [address] [place], [place], [address] Phone: [phone]   Fax:  [phone]  Physical Therapy Treatment  Patient Details  Name: Meagan Powers MRN: [id number] Date of Birth: [birth year] Referring Provider (PT): Dr. Kathyrn Sheriff   Encounter Date: 10/23/2021   PT End of Session - 10/23/21 1343     Visit Number 5    Number of Visits 9    Date for PT Re-Evaluation 11/14/21    Authorization Type eval: 09/19/21    PT Start Time 4235    PT Stop Time 1400    PT Time Calculation (min) 43 min    Activity Tolerance Patient tolerated treatment well    Behavior During Therapy WFL for tasks assessed/performed               Past Medical History:  Diagnosis Date   Abnormal uterine bleeding (AUB)    Acanthosis nigricans    AR (allergic rhinitis)    Cervical dysplasia    Dysmenorrhea    Dyspareunia in female    Incompetent cervix    Increased BMI    Migraine    Migraine    Pre-diabetes     Past Surgical History:  Procedure Laterality Date   CERVICAL BIOPSY  W/ LOOP ELECTRODE EXCISION     CERVICAL CERCLAGE     x 2   CHOLECYSTECTOMY     COLONOSCOPY WITH PROPOFOL N/A 10/15/2018   Procedure: COLONOSCOPY WITH PROPOFOL;  Surgeon: Virgel Manifold, MD;  Location: [facility] ENDOSCOPY;  Service: Endoscopy;  Laterality: N/A;   DILATION AND CURETTAGE OF UTERUS     HYSTEROSCOPY WITH D & C     LAPAROSCOPY      There were no vitals filed for this visit.   Subjective Assessment - 10/23/21 1318     Subjective Pt reports that she is doing well today. She reports approximately 85% improvement in her symptoms since starting with therapy. No specific questions or concerns currently.    Pertinent History Pt reports that the day after Thanksgiving between 12-1 she started feeling lightheadedness, nausea, and vomiting. She laid down and felt like the symptoms were worsening. Pt states that she felt very weak and when her eyes were open  she was experiencing a shifting environment/vertigo. She was having to walk with her eyes closed due to the symptoms. Ultimately she went to [facility] but left because the wait was too long. She ended up at [facility] and was put on an antibiotic for a presumed left ear infection due to fluid being present on exam. She also has a history of migraines (3-4 last year, takes Imitrex prn, no prophylactic medications) and started with a migraine shortly after her symptoms began. Pt had a follow-up appointment with her PCP and was referred to ENT due to ongoing dizziness. She saw ENT who ordered a VNG study which showed a 31% L caloric weakness as well as abnormal smooth pursuits and saccades. Pt is followed by Dr. Melrose Nakayama with neurology for her MS which was diagnosed in 2021. Her initial MS symptoms included bilateral leg numbness and groin numbness. She gets infusions every 8 weeks and one cervical/brain MRI each year. Last cervical and brain MRI were 11/2021 which showed lesions in the cerebral white matter, brainstem, and upper cervical spinal cord without interval progression or evidence of active demyelination. Decreased size of a left frontal lesion. She notified Dr. Melrose Nakayama of her dizziness and saw him for a visit on 08/08/21  at which time she was started on nortriptyline. Pt works at a [employer] and has not been back to work since her symptoms started. She reports that her work is very stressful but there were no added stressors at the time of her symptom onset.    Limitations Walking;Standing    Diagnostic tests VNG, Brain MRI (11/2020): see history    Patient Stated Goals Improve dizziness and return to work                 TREATMENT   Neuromuscular Re-education  Updated outcome measures with patient (see goal section); VOR x 1 horizontal in standing with feet together and target at arms length on plain wall x 30s (4-5/10 dizziness); VOR x 1 horizontal during forward and retro ambulation 70'  x 3, pt reports mild dizziness. Forward gait in hallway with vertical ball lift with head/eye follow 2 x 75', pt with mild slowing of gait; Forward gait in hallway with horizontal ball tosses with therapist with head/eye follow x 75' toward each side; 360 degree single body rolls on wall with eyes open x 1 each direction, eyes closed x 1 each direction; 360 degree double body rolls will eyes open x 2 each direction; 180 degree quick turns on command during gait in hallway x multiple bouts each direction; Standing on Airex pad with feet together with ball lifts and ball horizontal hand passes with head/eye follow x 1 minute each; Standing on Airex pad in semi tandem position ball horizontal hand passes with head/eye follow x 1 minute with each foot forward;   Pt will benefit from PT services to address deficits in dizziness and balance in order to return to full function at home and work with less symptoms.    Pt demonstrates excellent motivation during session today.  Updated outcome measures and goals with patient during session.  Overall she reports approximately 85% improvement in symptoms and starting with therapy. Her FOTO score improved from 48 at initial evaluation to 53 today. Her [place] decreased from 60/100 initial evaluation to 22/100 today. Progressed gaze stabilization exercises to ambulation in the hallway.  Continued with habituation exercises such as head turns during ambulation as well as 180 degree and 360 degree turns.  Pt encouraged to follow-up as scheduled. Pt will benefit from PT services to address deficits in dizziness and balance in order to return to full function at home and work with less symptoms.                PT Short Term Goals - 10/24/21 1338       PT SHORT TERM GOAL #1   Title Pt will be independent with HEP in order to improve dizziness in order to improve symptom-free function at home and work.    Time 4    Period Weeks    Status Partially Met     Target Date 10/17/21               PT Long Term Goals - 10/23/21 1321       PT LONG TERM GOAL #1   Title Pt will increase FOTO to at least 59 in order to demonstrate significant improvement in function related to dizziness    Baseline 09/19/21: 48; 10/23/21: 53    Time 8    Period Weeks    Status Partially Met    Target Date 11/14/21      PT LONG TERM GOAL #2   Title Pt will decrease DHI  score by at least 18 points in order to demonstrate clinically significant reduction in disability related to dizziness    Baseline 09/25/21: 60/100; 10/23/21: 22/100    Time 8    Period Weeks    Status Achieved    Target Date --      PT LONG TERM GOAL #3   Title Pt will report at least 75% improvement in dizziness symptoms in order to demonstrate ability to return to full function at home and work    Baseline 10/23/21: 85% improved    Time 8    Period Weeks    Status Achieved    Target Date --                   Plan - 10/24/21 1319     Clinical Impression Statement Pt demonstrates excellent motivation during session today.  Updated outcome measures and goals with patient during session.  Overall she reports approximately 85% improvement in symptoms and starting with therapy. Her FOTO score improved from 48 at initial evaluation to 53 today. Her [place] decreased from 60/100 initial evaluation to 22/100 today. Progressed gaze stabilization exercises to ambulation in the hallway.  Continued with habituation exercises such as head turns during ambulation as well as 180 degree and 360 degree turns.  Pt encouraged to follow-up as scheduled. Pt will benefit from PT services to address deficits in dizziness and balance in order to return to full function at home and work with less symptoms.    Personal Factors and Comorbidities Comorbidity 2    Comorbidities Migraine, MS    Examination-Activity Limitations Bend;Caring for Others;Stand;Transfers    Examination-Participation Restrictions Community  Activity;Occupation    Stability/Clinical Decision Making Evolving/Moderate complexity    Rehab Potential Good    PT Frequency 1x / week    PT Duration 8 weeks    PT Treatment/Interventions Canalith Repostioning;Cryotherapy;Electrical Stimulation;Iontophoresis 62m/ml Dexamethasone;Moist Heat;Traction;Ultrasound;DME Instruction;Gait training;Functional mobility training;Therapeutic activities;Therapeutic exercise;Balance training;Neuromuscular re-education;Patient/family education;Manual techniques;Passive range of motion;Dry needling;Energy conservation;Vestibular;Spinal Manipulations;Joint Manipulations    PT Next Visit Plan Review HEP, progress gaze stabilization and habituation exercises    PT Home Exercise Plan Access Code: [email or web address]    [email or web address] Agree with Plan of Care Patient               Patient will benefit from skilled therapeutic intervention in order to improve the following deficits and impairments:  Dizziness  Visit Diagnosis: Dizziness and giddiness     Problem List Patient Active Problem List   Diagnosis Date Noted   Blood per rectum    Anal fissure    Abnormal uterine bleeding (AUB) 10/25/2015   Endometrial polyp 10/25/2015   Acanthosis nigricans 10/25/2015   Allergic rhinitis 10/25/2015   Moderate cervical dysplasia 10/25/2015   Prediabetes 10/25/2015   Dyspareunia, female 10/25/2015   Dysmenorrhea 10/25/2015   Increased BMI 10/25/2015   JPhillips GroutPT, DPT, GCS  Chevis Weisensel, PT 10/24/2021, 1:42 PM  [facility] [place] [address]Phone: [phone]  Fax:  [phone] Name: Meagan SULLIVANTMRN: [id number] of Birth: [birth year]

## 2021-10-25 ENCOUNTER — Ambulatory Visit: Payer: Medicaid Other

## 2021-10-30 ENCOUNTER — Other Ambulatory Visit: Payer: Self-pay

## 2021-10-30 ENCOUNTER — Ambulatory Visit: Payer: Medicaid Other | Attending: Otolaryngology

## 2021-10-30 DIAGNOSIS — R42 Dizziness and giddiness: Secondary | ICD-10-CM | POA: Diagnosis not present

## 2021-10-30 NOTE — Therapy (Signed)
[facility] [address] [place], [place], [address] Phone: [phone]   Fax:  [phone]  Physical Therapy Treatment  Patient Details  Name: Meagan Powers MRN: [id number] Date of Birth: 01-24-1986 Referring Provider (PT): Dr. Kathyrn Sheriff   Encounter Date: 10/30/2021   PT End of Session - 10/30/21 1323     Visit Number 6    Number of Visits 9    Date for PT Re-Evaluation 11/14/21    Authorization Type eval: 09/19/21    PT Start Time 1315    PT Stop Time 1400    PT Time Calculation (min) 45 min    Activity Tolerance Patient tolerated treatment well    Behavior During Therapy WFL for tasks assessed/performed                Past Medical History:  Diagnosis Date   Abnormal uterine bleeding (AUB)    Acanthosis nigricans    AR (allergic rhinitis)    Cervical dysplasia    Dysmenorrhea    Dyspareunia in female    Incompetent cervix    Increased BMI    Migraine    Migraine    Pre-diabetes     Past Surgical History:  Procedure Laterality Date   CERVICAL BIOPSY  W/ LOOP ELECTRODE EXCISION     CERVICAL CERCLAGE     x 2   CHOLECYSTECTOMY     COLONOSCOPY WITH PROPOFOL N/A 10/15/2018   Procedure: COLONOSCOPY WITH PROPOFOL;  Surgeon: Virgel Manifold, MD;  Location: [facility] ENDOSCOPY;  Service: Endoscopy;  Laterality: N/A;   DILATION AND CURETTAGE OF UTERUS     HYSTEROSCOPY WITH D & C     LAPAROSCOPY      There were no vitals filed for this visit.   Subjective Assessment - 10/30/21 1318     Subjective Pt reports that she has a migraine today and is unsure if she can complete an entire therapy session. No significant changes since the last therapy session. No specific questions or concerns currently.    Pertinent History Pt reports that the day after Thanksgiving between 12-1 she started feeling lightheadedness, nausea, and vomiting. She laid down and felt like the symptoms were worsening. Pt states that she felt very  weak and when her eyes were open she was experiencing a shifting environment/vertigo. She was having to walk with her eyes closed due to the symptoms. Ultimately she went to [facility] but left because the wait was too long. She ended up at [facility] and was put on an antibiotic for a presumed left ear infection due to fluid being present on exam. She also has a history of migraines (3-4 last year, takes Imitrex prn, no prophylactic medications) and started with a migraine shortly after her symptoms began. Pt had a follow-up appointment with her PCP and was referred to ENT due to ongoing dizziness. She saw ENT who ordered a VNG study which showed a 31% L caloric weakness as well as abnormal smooth pursuits and saccades. Pt is followed by Dr. Melrose Nakayama with neurology for her MS which was diagnosed in 2021. Her initial MS symptoms included bilateral leg numbness and groin numbness. She gets infusions every 8 weeks and one cervical/brain MRI each year. Last cervical and brain MRI were 11/2021 which showed lesions in the cerebral white matter, brainstem, and upper cervical spinal cord without interval progression or evidence of active demyelination. Decreased size of a left frontal lesion. She notified Dr. Melrose Nakayama of her dizziness  and saw him for a visit on 08/08/21 at which time she was started on nortriptyline. Pt works at a [employer] and has not been back to work since her symptoms started. She reports that her work is very stressful but there were no added stressors at the time of her symptom onset.    Limitations Walking;Standing    Diagnostic tests VNG, Brain MRI (11/2020): see history    Patient Stated Goals Improve dizziness and return to work                  TREATMENT   Neuromuscular Re-education  VOR x 1 horizontal during forward and retro ambulation 70' x 2, pt denies dizziness; VOR x 1 vertical during forward and retro ambulation 70' x 2 each, pt reports significant dizziness and has to  rest between sets in order for dizziness to decrease before resuming; Forward gait in hallway with vertical ball toss with head/eye follow 2 x 70', pt with mild dizziness;   Pt will benefit from PT services to address deficits in dizziness and balance in order to return to full function at home and work with less symptoms.    Pt demonstrates excellent motivation during session today. Pt is currently having a migraine and needs to end the session early. Continued with gaze stabilization and habituation exercises and progressed VOR x 1 to vertical which is more symptomatic. Pt encouraged to progress home gaze stabilization exercises to vertical and also to follow-up as scheduled. Pt will benefit from PT services to address deficits in dizziness and balance in order to return to full function at home and work with less symptoms.                PT Short Term Goals - 10/24/21 1338       PT SHORT TERM GOAL #1   Title Pt will be independent with HEP in order to improve dizziness in order to improve symptom-free function at home and work.    Time 4    Period Weeks    Status Partially Met    Target Date 10/17/21               PT Long Term Goals - 10/23/21 1321       PT LONG TERM GOAL #1   Title Pt will increase FOTO to at least 59 in order to demonstrate significant improvement in function related to dizziness    Baseline 09/19/21: 48; 10/23/21: 53    Time 8    Period Weeks    Status Partially Met    Target Date 11/14/21      PT LONG TERM GOAL #2   Title Pt will decrease DHI score by at least 18 points in order to demonstrate clinically significant reduction in disability related to dizziness    Baseline 09/25/21: 60/100; 10/23/21: 22/100    Time 8    Period Weeks    Status Achieved    Target Date --      PT LONG TERM GOAL #3   Title Pt will report at least 75% improvement in dizziness symptoms in order to demonstrate ability to return to full function at home and work     Baseline 10/23/21: 85% improved    Time 8    Period Weeks    Status Achieved    Target Date --                   Plan - 10/30/21 1348  Clinical Impression Statement Pt demonstrates excellent motivation during session today. Pt is currently having a migraine and needs to end the session early. Continued with gaze stabilization and habituation exercises and progressed VOR x 1 to vertical which is more symptomatic. Pt encouraged to progress home gaze stabilization exercises to vertical and also to follow-up as scheduled. Pt will benefit from PT services to address deficits in dizziness and balance in order to return to full function at home and work with less symptoms.    Personal Factors and Comorbidities Comorbidity 2    Comorbidities Migraine, MS    Examination-Activity Limitations [place];Caring for Others;Stand;Transfers    Examination-Participation Restrictions Community Activity;Occupation    Stability/Clinical Decision Making Evolving/Moderate complexity    Rehab Potential Good    PT Frequency 1x / week    PT Duration 8 weeks    PT Treatment/Interventions Canalith Repostioning;Cryotherapy;Electrical Stimulation;Iontophoresis 45m/ml Dexamethasone;Moist Heat;Traction;Ultrasound;DME Instruction;Gait training;Functional mobility training;Therapeutic activities;Therapeutic exercise;Balance training;Neuromuscular re-education;Patient/family education;Manual techniques;Passive range of motion;Dry needling;Energy conservation;Vestibular;Spinal Manipulations;Joint Manipulations    PT Next Visit Plan Review HEP, progress gaze stabilization and habituation exercises    PT Home Exercise Plan Access Code: [email or web address]    [email or web address] Agree with Plan of Care Patient                Patient will benefit from skilled therapeutic intervention in order to improve the following deficits and impairments:  Dizziness  Visit Diagnosis: Dizziness and giddiness     Problem List Patient  Active Problem List   Diagnosis Date Noted   Blood per rectum    Anal fissure    Abnormal uterine bleeding (AUB) 10/25/2015   Endometrial polyp 10/25/2015   Acanthosis nigricans 10/25/2015   Allergic rhinitis 10/25/2015   Moderate cervical dysplasia 10/25/2015   Prediabetes 10/25/2015   Dyspareunia, female 10/25/2015   Dysmenorrhea 10/25/2015   Increased BMI 10/25/2015   JPhillips GroutPT, DPT, GCS  Cornell Bourbon, PT 10/30/2021, 1:55 PM  [facility], [place] [place] [address]Phone: [phone]  Fax:  [phone] Name: Meagan SCHRAEDERMRN: [id number] of Birth: 3May 02, 1987

## 2021-11-06 ENCOUNTER — Ambulatory Visit: Payer: Medicaid Other

## 2021-11-06 ENCOUNTER — Telehealth (HOSPITAL_COMMUNITY): Payer: Self-pay

## 2021-11-06 NOTE — Telephone Encounter (Signed)
Spoke with [place], clinical staff for Dr. Melrose Nakayama, at [facility] requesting new orders for this pt's Tysabri be faxed to [facility]. Provided with fax number and given instructions on what needs to be included in order. Verbalized understanding and states new orders will be sent.  ?

## 2021-11-07 ENCOUNTER — Encounter (HOSPITAL_COMMUNITY): Payer: Medicaid Other

## 2021-11-13 ENCOUNTER — Ambulatory Visit: Payer: Medicaid Other

## 2021-11-13 ENCOUNTER — Other Ambulatory Visit: Payer: Self-pay

## 2021-11-22 ENCOUNTER — Telehealth (HOSPITAL_COMMUNITY): Payer: Self-pay

## 2021-11-22 NOTE — Telephone Encounter (Signed)
Spoke with Meagan Powers CMA at [facility], clinical staff to Dr. Gurney Maxin, clarified faxed order for Tysabri, that pt is to receive dose every 8 weeks for 6 months, return to see Dr. Melrose Nakayama in June, 2023. Not necessary to premedicate pt prior to infusion since pt has a hx of tolerating infusion without difficulty, may give indicated PRN medications in the event pt has a reaction. Orders read back to CMA.  ?

## 2021-11-22 NOTE — Therapy (Signed)
?OUTPATIENT PHYSICAL THERAPY TREATMENT NOTE/DISCHARGE ? ? ?Patient Name: Meagan Powers ?MRN: [id number] ?DOB:01-15-1986, 36 y.o., female ?Today's Date: 11/23/2021 ? ?PCP: Ellamae Sia, MD ?REFERRING PROVIDER: Margaretha Sheffield, MD ? ? PT End of Session - 11/23/21 1544   ? ? Visit Number 7   ? Number of Visits 10   ? Date for PT Re-Evaluation 11/30/21   ? Authorization Type eval: 1/82/99, recertificaiton for final visit 11/23/21   ? PT Start Time 1532   ? PT Stop Time 1615   ? PT Time Calculation (min) 43 min   ? Activity Tolerance Patient tolerated treatment well   ? Behavior During Therapy [facility] for tasks assessed/performed   ? ?  ?  ? ?  ? ? ?Past Medical History:  ?Diagnosis Date  ? Abnormal uterine bleeding (AUB)   ? Acanthosis nigricans   ? AR (allergic rhinitis)   ? Cervical dysplasia   ? Dysmenorrhea   ? Dyspareunia in female   ? Incompetent cervix   ? Increased BMI   ? Migraine   ? Migraine   ? Pre-diabetes   ? ?Past Surgical History:  ?Procedure Laterality Date  ? CERVICAL BIOPSY  W/ LOOP ELECTRODE EXCISION    ? CERVICAL CERCLAGE    ? x 2  ? CHOLECYSTECTOMY    ? COLONOSCOPY WITH PROPOFOL N/A 10/15/2018  ? Procedure: COLONOSCOPY WITH PROPOFOL;  Surgeon: Virgel Manifold, MD;  Location: [facility] ENDOSCOPY;  Service: Endoscopy;  Laterality: N/A;  ? DILATION AND CURETTAGE OF UTERUS    ? HYSTEROSCOPY WITH D & C    ? LAPAROSCOPY    ? ?Patient Active Problem List  ? Diagnosis Date Noted  ? Blood per rectum   ? Anal fissure   ? Abnormal uterine bleeding (AUB) 10/25/2015  ? Endometrial polyp 10/25/2015  ? Acanthosis nigricans 10/25/2015  ? Allergic rhinitis 10/25/2015  ? Moderate cervical dysplasia 10/25/2015  ? Prediabetes 10/25/2015  ? Dyspareunia, female 10/25/2015  ? Dysmenorrhea 10/25/2015  ? Increased BMI 10/25/2015  ? ? ?REFERRING DIAG: Vestibular neuronitis, unspecified ear ? ?THERAPY DIAG: Dizziness and giddiness ? ?PERTINENT HISTORY: Pt reports that the day after Thanksgiving between 12-1 she started feeling  lightheadedness, nausea, and vomiting. She laid down and felt like the symptoms were worsening. Pt states that she felt very weak and when her eyes were open she was experiencing a shifting environment/vertigo. She was having to walk with her eyes closed due to the symptoms. Ultimately she went to [facility] but left because the wait was too long. She ended up at [place] and was put on an antibiotic for a presumed left ear infection due to fluid being present on exam. She also has a history of migraines (3-4 last year, takes Imitrex prn, no prophylactic medications) and started with a migraine shortly after her symptoms began. Pt had a follow-up appointment with her PCP and was referred to ENT due to ongoing dizziness. She saw ENT who ordered a VNG study which showed a 31% L caloric weakness as well as abnormal smooth pursuits and saccades. Pt is followed by Dr. Melrose Nakayama with neurology for her MS which was diagnosed in 2021. Her initial MS symptoms included bilateral leg numbness and groin numbness. She gets infusions every 8 weeks and one cervical/brain MRI each year. Last cervical and brain MRI were 11/2021 which showed lesions in the cerebral white matter, brainstem, and upper cervical spinal cord without interval progression or evidence of active demyelination. Decreased size of a left frontal  lesion. She notified Dr. Melrose Nakayama of her dizziness and saw him for a visit on 08/08/21 at which time she was started on nortriptyline. Pt works at a [employer] and has not been back to work since her symptoms started. She reports that her work is very stressful but there were no added stressors at the time of her symptom onset. ? ? ?PRECAUTIONS: None ? ?SUBJECTIVE: Pt reports that she continues to experience improvement in her symptoms. Overall she states that she has seen about 90% improvement since starting therapy. She only notices significant dizziness if she tries to turn her head too quickly. She has been having acute  on chronic low back pain over the last few days which she rates as 8/10 upon arrival. No specific questions currently. ? ?PAIN:  ?Are you having pain?  Chronic back pain, 8/10 ? ? ?TODAY'S TREATMENT: ?  ?  ?Neuromuscular Re-education  ?Updated outcome measures/goals with patient (see goal section) ?VOR x 1 horizontal during forward and retro ambulation 70' x 2, pt denies dizziness; ?VOR x 1 vertical during forward and retro ambulation 70' x 2, pt denies dizziness; ?Forward gait in hallway with vertical ball toss with head/eye follow 2 x 70', pt with denies dizziness; ?Forward gait in hallway with horizontal ball toss to therapist with head/eye follow 2 x 70', pt with denies dizziness; ?Extensive conversation about discharge with updated HEP and education about progressions/regressions at home. ? ? ?  ?  ? ? ?PATIENT EDUCATION: ?Education details: Pt educated throughout session about proper posture and technique with exercises. Improved exercise technique, movement at target joints, use of target muscles after min to mod verbal, visual, tactile cues. Education about discharge and HEP progressions/regressions ?Person educated: Patient ?Education method: Explanation ?Education comprehension: verbalized understanding ? ? ?HOME EXERCISE PROGRAM: ?Access Code: [email or web address] ? ? ? ? PT Short Term Goals - 10/24/21 1338   ? ?  ? PT SHORT TERM GOAL #1  ? Title Pt will be independent with HEP in order to improve dizziness in order to improve symptom-free function at home and work.   ? Time 4   ? Period Weeks   ? Status Partially Met   ? Target Date 10/17/21   ? ?  ?  ? ?  ? ? ? PT Long Term Goals - 10/23/21 1321   ? ?  ? PT LONG TERM GOAL #1  ? Title Pt will increase FOTO to at least 59 in order to demonstrate significant improvement in function related to dizziness   ? Baseline 09/19/21: 48; 10/23/21: 53; 11/23/21: 60  ? Time 8   ? Period Weeks   ? Status Achieved  ? Target Date   ?  ? PT LONG TERM GOAL #2  ? Title Pt will decrease  DHI score by at least 18 points in order to demonstrate clinically significant reduction in disability related to dizziness   ? Baseline 09/25/21: 60/100; 10/23/21: 22/100; 11/23/21: 10/100  ? Time 8   ? Period Weeks   ? Status Achieved   ? Target Date --   ?  ? PT LONG TERM GOAL #3  ? Title Pt will report at least 75% improvement in dizziness symptoms in order to demonstrate ability to return to full function at home and work   ? Baseline 10/23/21: 85% improved; 11/23/21: 90%   ? Time 8   ? Period Weeks   ? Status Achieved   ? Target Date --   ? ?  ?  ? ?  ? ? ?  Plan - 11/23/21 1545   ? ? Clinical Impression Statement Pt demonstrates excellent motivation during session today.  Updated outcome measures and goals with patient during session.  Overall she reports approximately 90% improvement in symptoms and starting with therapy. Her FOTO score improved from 48 at initial evaluation to 60 today. Her [place] decreased from 60/100 initial evaluation to 10/100 today. Progressed gaze stabilization exercises with patient during session today and updated her HEP.  Pt is ready for discharge at this time to continue her home program independently.   ? Personal Factors and Comorbidities Comorbidity 2   ? Comorbidities Migraine, MS   ? Examination-Activity Limitations [place];Caring for Others;Stand;Transfers   ? Examination-Participation Restrictions Community Activity;Occupation   ? Stability/Clinical Decision Making Evolving/Moderate complexity   ? Rehab Potential Good   ? PT Frequency 1x / week   ? PT Duration 8 weeks   ? PT Treatment/Interventions Canalith Repostioning;Cryotherapy;Electrical Stimulation;Iontophoresis 58m/ml Dexamethasone;Moist Heat;Traction;Ultrasound;DME Instruction;Gait training;Functional mobility training;Therapeutic activities;Therapeutic exercise;Balance training;Neuromuscular re-education;Patient/family education;Manual techniques;Passive range of motion;Dry needling;Energy conservation;Vestibular;Spinal  Manipulations;Joint Manipulations   ? PT Next Visit Plan Review HEP, progress gaze stabilization and habituation exercises   ? PT Home Exercise Plan Access Code: [email or web address]  ? Consulted and Agree with Plan

## 2021-11-23 ENCOUNTER — Ambulatory Visit: Payer: Medicaid Other

## 2021-11-23 DIAGNOSIS — R42 Dizziness and giddiness: Secondary | ICD-10-CM | POA: Diagnosis not present

## 2021-12-08 ENCOUNTER — Non-Acute Institutional Stay (HOSPITAL_COMMUNITY)
Admission: RE | Admit: 2021-12-08 | Discharge: 2021-12-08 | Disposition: A | Discharge status: Home / Self Care | Payer: Medicaid Other | Source: Ambulatory Visit | Attending: Internal Medicine | Admitting: Internal Medicine

## 2021-12-08 DIAGNOSIS — G35 Multiple sclerosis: Secondary | ICD-10-CM | POA: Diagnosis not present

## 2021-12-08 MED ORDER — NATALIZUMAB 300 MG/15ML IV CONC
300.0000 mg | INTRAVENOUS | Status: DC
  Administered 2021-12-08: 300 mg via INTRAVENOUS
  Filled 2021-12-08: qty 15

## 2021-12-08 MED ORDER — SODIUM CHLORIDE 0.9 % IV SOLN: INTRAVENOUS | Status: DC | PRN

## 2021-12-08 NOTE — Progress Notes (Signed)
PATIENT CARE CENTER NOTE ? ? ?Diagnosis: Relapsing-Remitting Multiple Sclerosis  ? ? ?Provider: Gurney Maxin, MD ? ? ?Procedure: Tysabri infusion  ? ? ?Note:  Patient received Tysabri 300 mg infusion via PIV. Patient tolerated infusion well with no adverse reaction. Patient declined to stay for 1 hour post infusion observation. Vital signs remained stable. AVS offered but patient declined. Patient to come every 8 weeks for infusions. Patient's next appointment scheduled for 02/02/22. Patient alert, oriented and ambulatory at discharge.   ?

## 2021-12-29 ENCOUNTER — Other Ambulatory Visit: Payer: Self-pay | Admitting: Obstetrics and Gynecology

## 2022-02-02 ENCOUNTER — Non-Acute Institutional Stay (HOSPITAL_COMMUNITY)
Admission: RE | Admit: 2022-02-02 | Discharge: 2022-02-02 | Disposition: A | Discharge status: Home / Self Care | Payer: Medicaid Other | Source: Ambulatory Visit | Attending: Internal Medicine | Admitting: Internal Medicine

## 2022-02-02 DIAGNOSIS — G35 Multiple sclerosis: Secondary | ICD-10-CM | POA: Diagnosis present

## 2022-02-02 MED ORDER — SODIUM CHLORIDE 0.9 % IV SOLN
300.0000 mg | INTRAVENOUS | Status: DC
  Administered 2022-02-02: 300 mg via INTRAVENOUS
  Filled 2022-02-02: qty 15

## 2022-02-02 MED ORDER — SODIUM CHLORIDE 0.9 % IV SOLN: INTRAVENOUS | Status: DC | PRN

## 2022-02-02 NOTE — Progress Notes (Signed)
PATIENT CARE CENTER NOTE     Diagnosis: Relapsing-Remitting Multiple Sclerosis      Provider: Gurney Maxin, MD     Procedure: Tysabri infusion      Note:  Patient received Tysabri 300 mg infusion via PIV. Patient tolerated infusion well with no adverse reaction. Patient declined to stay for 1 hour post infusion observation. Vital signs remained stable. AVS offered but patient declined. Patient to come every 8 weeks for infusions. Patient's next appointment scheduled for 03/30/22. Patient alert, oriented and ambulatory at discharge.

## 2022-03-30 ENCOUNTER — Non-Acute Institutional Stay (HOSPITAL_COMMUNITY)
Admission: RE | Admit: 2022-03-30 | Discharge: 2022-03-30 | Disposition: A | Discharge status: Home / Self Care | Payer: Medicaid Other | Source: Ambulatory Visit | Attending: Internal Medicine | Admitting: Internal Medicine

## 2022-03-30 DIAGNOSIS — K5090 Crohn's disease, unspecified, without complications: Secondary | ICD-10-CM | POA: Diagnosis not present

## 2022-03-30 DIAGNOSIS — G35 Multiple sclerosis: Secondary | ICD-10-CM | POA: Insufficient documentation

## 2022-03-30 MED ORDER — SODIUM CHLORIDE 0.9 % IV SOLN
300.0000 mg | Freq: Once | INTRAVENOUS | Status: AC
  Administered 2022-03-30: 300 mg via INTRAVENOUS
  Filled 2022-03-30: qty 15

## 2022-03-30 MED ORDER — SODIUM CHLORIDE 0.9 % IV SOLN: INTRAVENOUS | Status: DC | PRN

## 2022-03-30 NOTE — Progress Notes (Signed)
PATIENT CARE CENTER NOTE:   Diagnosis: Relapsing- Remitting MS      Provider: Gurney Maxin MD     Procedure: Fritzi Mandes '300mg'$  infusion     Note: Patient received Tysabri infusion via PIV. No premeds required per orders. Tolerated infusion well with no adverse reaction. Vital signs stable. Pt declined AVS. Patient declined to stay for the 1 hour post-infusion observation. Pt to RTC in 8 weeks, instructed to schedule next appointment at front desk prior to leaving, verbalized understanding.  Alert, oriented and ambulatory at discharge.

## 2022-05-15 IMAGING — MR MR HEAD WO/W CM
15 series · 44 of 48 positions shown · IV contrast (gadavist)
Comparison: Head MRI 11/05/2019.  Cervical spine MRI 11/22/2019.

CLINICAL DATA: Multiple sclerosis. Right arm numbness, tingling,
and weakness beginning 2 weeks ago.

EXAM:
MRI HEAD WITHOUT AND WITH CONTRAST
MRI CERVICAL SPINE WITHOUT CONTRAST
TECHNIQUE: Multiplanar, multiecho pulse sequences of the brain and surrounding
structures were obtained without and with intravenous contrast.
Multiplanar, multiecho pulse sequences of the cervical spine, to
include the craniocervical junction and cervicothoracic junction,
were obtained without intravenous contrast.
CONTRAST:  10mL GADAVIST GADOBUTROL 1 MMOL/ML IV SOLN

[Series 1: ax dwi_tracew · axial · 3.0mm · 0.60mm/px · z∈[-6,+148]mm · 4 of 48 slices shown]
[im 1/48]
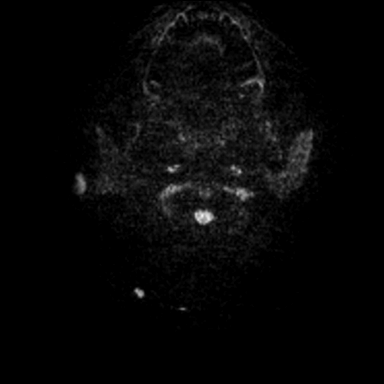
[im 16/48]
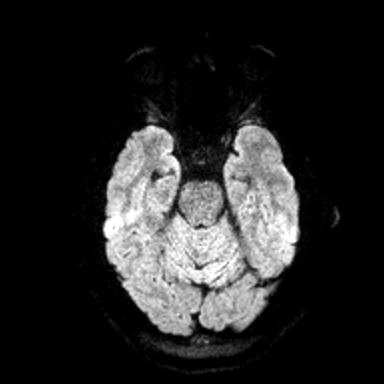
[im 32/48]
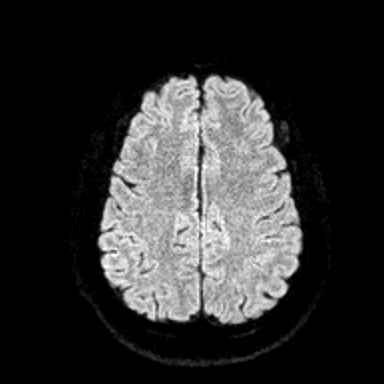
[im 48/48]
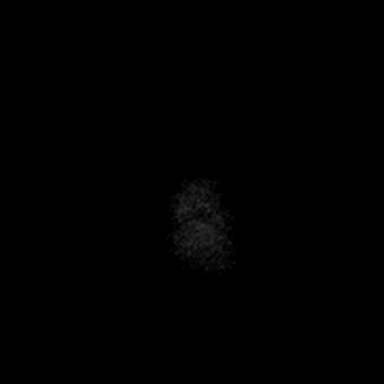

[Series 2: ax dwi_adc · axial · 3.0mm · 0.60mm/px · z∈[-6,+148]mm · 3 of 48 slices shown]
[im 1/48]
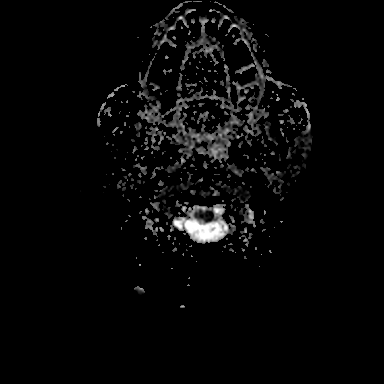
[im 24/48]
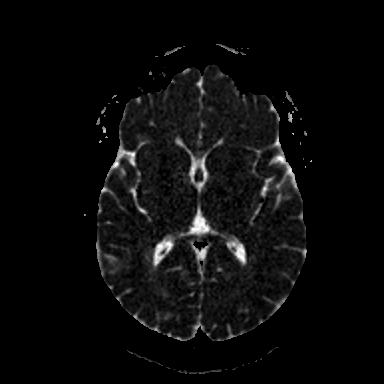
[im 48/48]
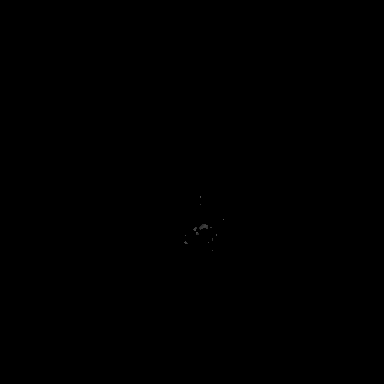

[Series 3: cor dwi_tracew · coronal · 5.0mm · 0.60mm/px · 2 of 40 slices shown]
[im 1/40]
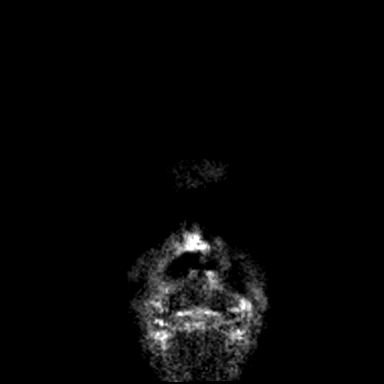
[im 40/40]
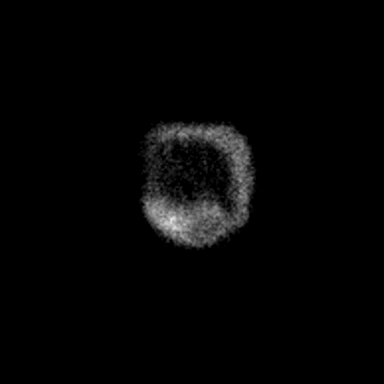

[Series 4: cor dwi_adc · coronal · 5.0mm · 0.60mm/px · 2 of 35 slices shown]
[im 1/35]
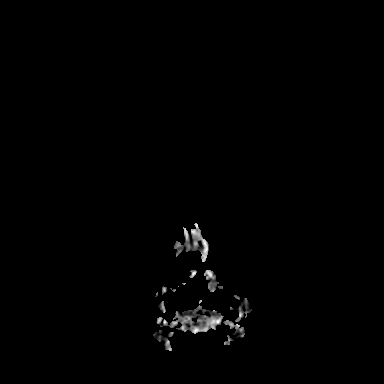
[im 35/35]
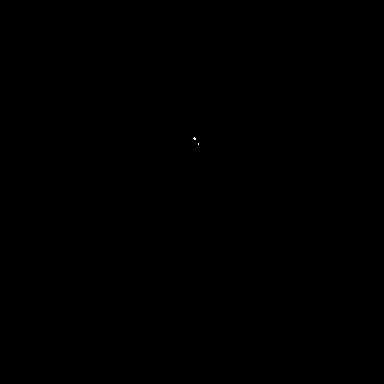

[Series 5: T1 · sagittal · 5.0mm · 0.62mm/px · 1 of 21 slices shown (1 of 2)]
[im 1/21]
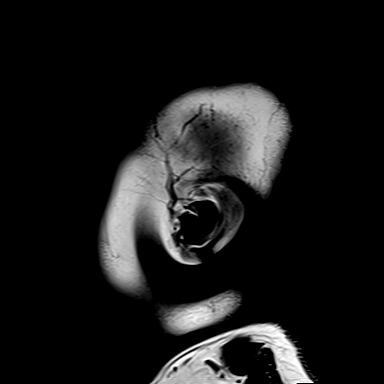

[Series 6: FLAIR · sagittal · 5.0mm · 0.94mm/px · 1 of 21 slices shown (1 of 2)]
[im 1/21]
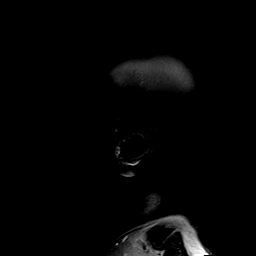

[Series 7: T2 · axial · 5.0mm · 0.53mm/px · 1 of 25 slices shown]
[im 1/25]
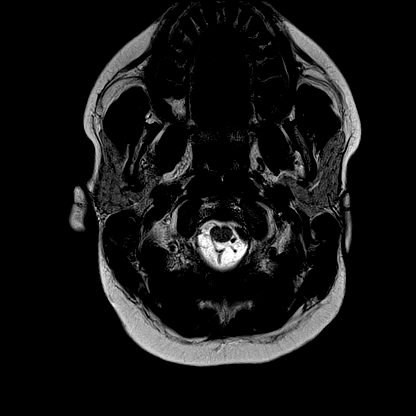

[Series 9: pha_images · axial · 3.0mm · 0.90mm/px · z∈[-16,+157]mm · 3 of 59 slices shown]
[im 1/59]
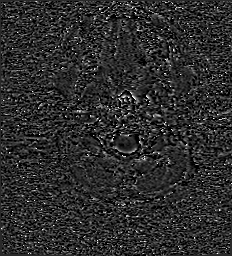
[im 30/59]
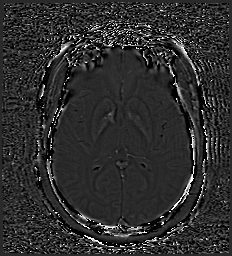
[im 59/59]
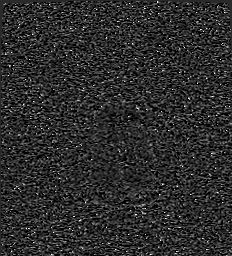

[Series 10: swi_images · axial · 3.0mm · 0.90mm/px · z∈[-19,+157]mm · 3 of 60 slices shown]
[im 1/60]
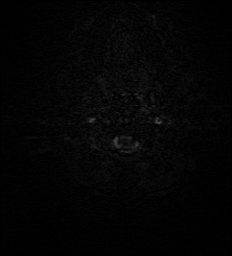
[im 30/60]
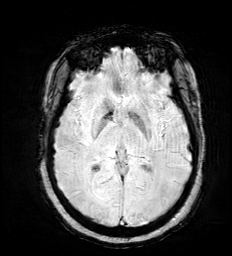
[im 60/60]
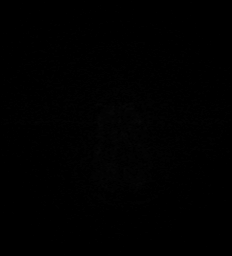

[Series 12: FLAIR · axial · 3.0mm · 0.53mm/px · z∈[-11,+150]mm · 3 of 55 slices shown (2 of 2)]
[im 1/55]
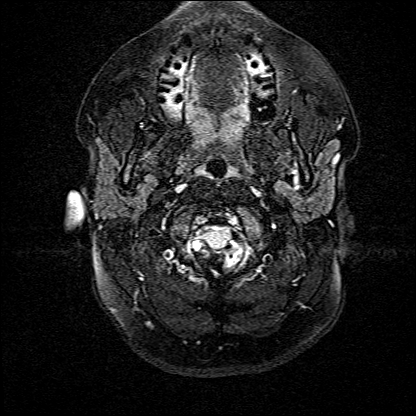
[im 28/55]
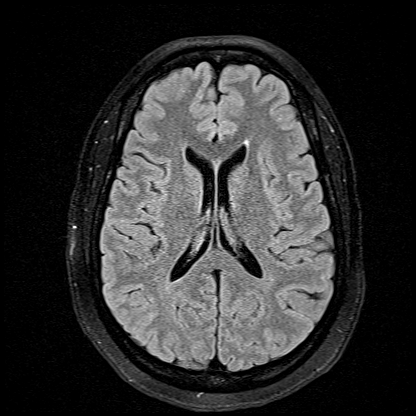
[im 55/55]
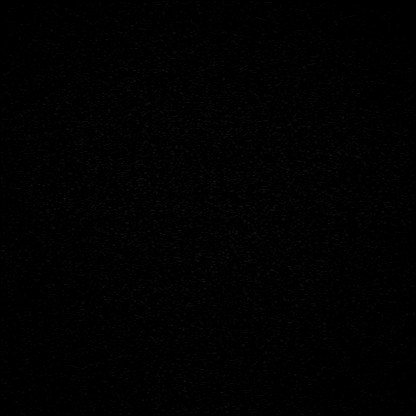

[Series 13: T1 · axial · 1.0mm · 0.98mm/px · z∈[-19,+154]mm · 8 of 174 slices shown (2 of 2)]
[im 1/174]
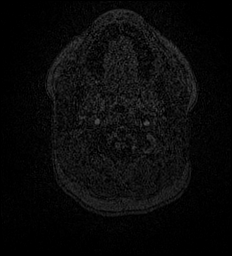
[im 20/174]
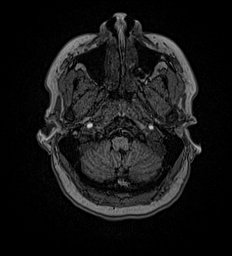
[im 58/174]
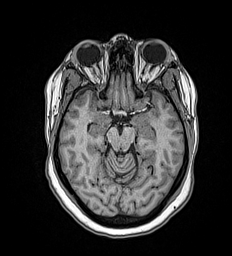
[im 77/174]
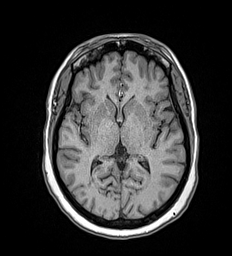
[im 97/174]
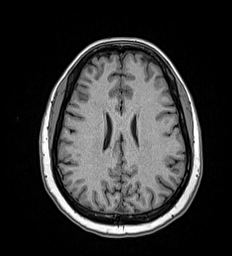
[im 116/174]
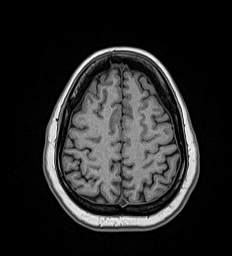
[im 154/174]
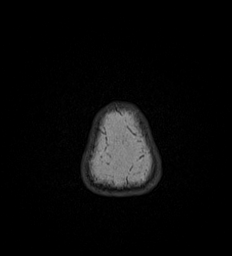
[im 174/174]
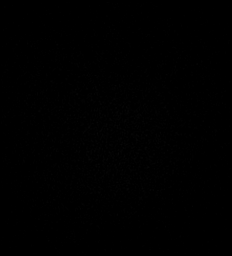

[Series 14: T2 post-contrast · coronal · 5.0mm · 0.57mm/px · 2 of 29 slices shown]
[im 1/29]
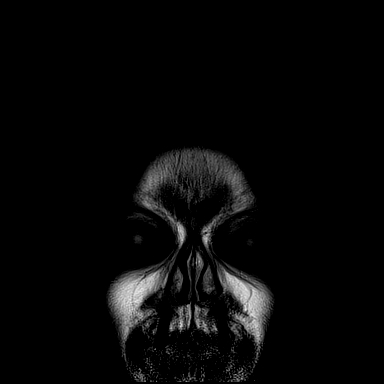
[im 29/29]
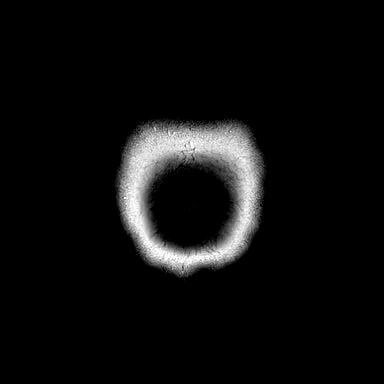

[Series 15: T1 post-contrast · axial · 1.0mm · 0.98mm/px · z∈[-19,+154]mm · 8 of 173 slices shown (1 of 3)]
[im 1/173]
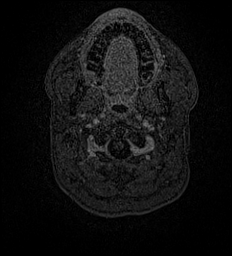
[im 20/173]
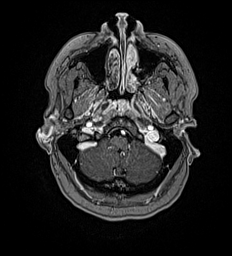
[im 58/173]
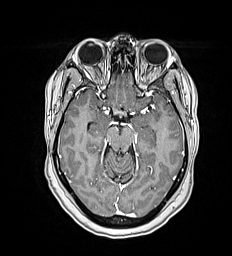
[im 77/173]
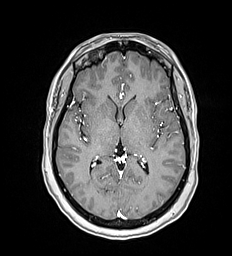
[im 96/173]
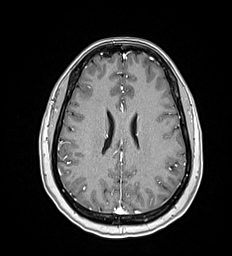
[im 115/173]
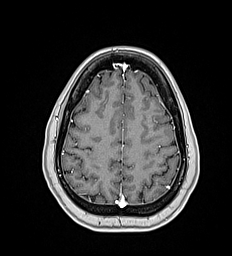
[im 153/173]
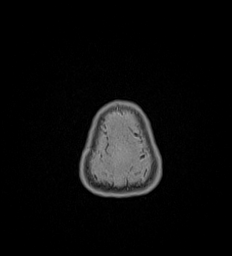
[im 173/173]
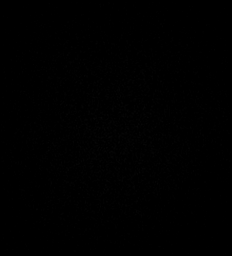

[Series 16: T1 post-contrast · coronal · 5.0mm · 0.57mm/px · 2 of 29 slices shown (2 of 3)]
[im 1/29]
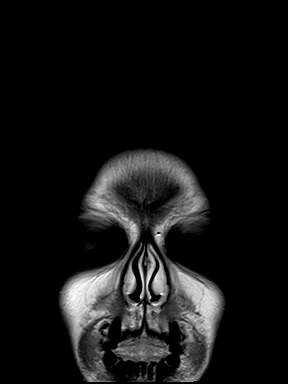
[im 29/29]
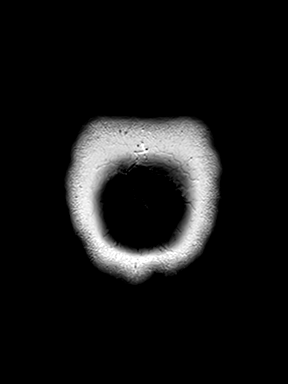

[Series 17: T1 post-contrast · sagittal · 5.0mm · 0.62mm/px · 1 of 21 slices shown (3 of 3)]
[im 1/21]
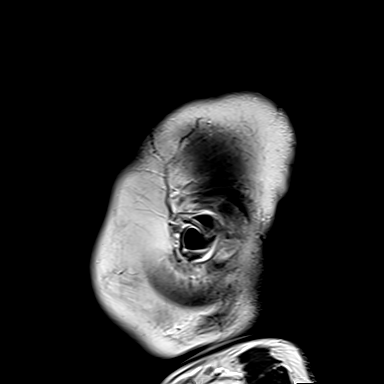

[44 of 48 positions shown; findings below may reference images not displayed]

FINDINGS: MRI HEAD FINDINGS

Brain: There is no evidence of an acute infarct, intracranial
hemorrhage, mass, midline shift, or extra-axial fluid collection.
The ventricles and sulci are normal. Multiple small foci of T2 FLAIR
hyperintensity are again seen in the juxtacortical, deep, and
periventricular white matter bilaterally. A lesion in the left
frontal white matter adjacent to the frontal horn of the lateral
ventricle has decreased in size (series 12, image 30), while other
lesions have not significantly changed. A lesion involving the right
body of the corpus callosum is unchanged.

A T2 hyperintense focus with associated chronic blood products in
the genu of the right internal capsule is unchanged. A small T2
hyperintense lesion in the far right lateral aspect of the pons near
the anterior aspect of the middle cerebellar peduncle is better
demonstrated on today's study but is also unchanged in size. No new
or enhancing lesions are identified.

Vascular: Major intracranial vascular flow voids are preserved with
the right vertebral artery being hypoplastic.

Skull and upper cervical spine: Unremarkable bone marrow signal.

Sinuses/Orbits: Unremarkable orbits. Paranasal sinuses and mastoid
air cells are clear.

Other: None.

MRI CERVICAL SPINE FINDINGS

Alignment: Chronic straightening of the normal cervical lordosis. No
listhesis.

Vertebrae: No fracture, suspicious osseous lesion, or significant
marrow edema.

Cord: Unchanged T2 hyperintense lesion involving the central and
dorsal spinal cord at C2-3. Otherwise normal cord signal and
caliber.

Posterior Fossa, vertebral arteries, paraspinal tissues:
Unremarkable.

Disc levels: Preserved disc space heights. No disc herniation or
stenosis.
IMPRESSION: Lesions in the cerebral white matter, brainstem, and upper cervical
spinal cord without interval progression or evidence of active
demyelination. Decreased size of a left frontal lesion.

## 2022-05-25 ENCOUNTER — Non-Acute Institutional Stay (HOSPITAL_COMMUNITY)
Admission: RE | Admit: 2022-05-25 | Discharge: 2022-05-25 | Disposition: A | Discharge status: Home / Self Care | Payer: Medicaid Other | Source: Ambulatory Visit | Attending: Internal Medicine | Admitting: Internal Medicine

## 2022-05-25 DIAGNOSIS — G35 Multiple sclerosis: Secondary | ICD-10-CM | POA: Insufficient documentation

## 2022-05-25 MED ORDER — SODIUM CHLORIDE 0.9 % IV SOLN: INTRAVENOUS | Status: DC | PRN

## 2022-05-25 MED ORDER — SODIUM CHLORIDE 0.9 % IV SOLN
300.0000 mg | INTRAVENOUS | Status: DC
  Administered 2022-05-25: 300 mg via INTRAVENOUS
  Filled 2022-05-25: qty 15

## 2022-05-25 NOTE — Progress Notes (Signed)
PATIENT CARE CENTER NOTE     Diagnosis: Relapsing-Remitting Multiple Sclerosis      Provider: Gurney Maxin, MD     Procedure: Tysabri infusion      Note:  Patient received Tysabri 300 mg infusion via PIV. Patient tolerated infusion well with no adverse reaction. Patient declined to stay for 1 hour post infusion observation. Vital signs remained stable. AVS offered but patient declined. Patient to come every 8 weeks for infusions and will schedule next appointment at the front desk. Patient alert, oriented and ambulatory at discharge.

## 2022-07-23 ENCOUNTER — Non-Acute Institutional Stay (HOSPITAL_COMMUNITY)
Admission: RE | Admit: 2022-07-23 | Discharge: 2022-07-23 | Disposition: A | Discharge status: Home / Self Care | Payer: Medicaid Other | Source: Ambulatory Visit | Attending: Internal Medicine | Admitting: Internal Medicine

## 2022-07-23 DIAGNOSIS — G35 Multiple sclerosis: Secondary | ICD-10-CM | POA: Insufficient documentation

## 2022-07-23 MED ORDER — SODIUM CHLORIDE 0.9 % IV SOLN
300.0000 mg | INTRAVENOUS | Status: DC
  Administered 2022-07-23: 300 mg via INTRAVENOUS
  Filled 2022-07-23: qty 15

## 2022-07-23 MED ORDER — SODIUM CHLORIDE 0.9 % IV SOLN
INTRAVENOUS | Status: DC | PRN
Start: 2022-07-23 — End: 2022-07-24

## 2022-07-23 NOTE — Progress Notes (Signed)
PATIENT CARE CENTER NOTE     Diagnosis: Relapsing-Remitting Multiple Sclerosis      Provider: Gurney Maxin, MD     Procedure: Tysabri infusion      Note:  Patient received Tysabri 300 mg infusion via PIV. Patient tolerated infusion well with no adverse reaction. Patient declined to stay for 1 hour post infusion observation. Vital signs remained stable. AVS offered but patient declined. Patient to come every 8 weeks for infusions and will schedule next appointment at the front desk. Patient's Tysabri order expires next month so patient will need new order prior to next infusion. Patient alert, oriented and ambulatory at discharge.

## 2022-09-17 ENCOUNTER — Encounter (HOSPITAL_COMMUNITY): Payer: Medicaid Other

## 2023-03-20 ENCOUNTER — Other Ambulatory Visit: Payer: Self-pay | Admitting: Neurology

## 2023-03-20 ENCOUNTER — Encounter: Payer: Self-pay | Admitting: Neurology

## 2023-03-20 DIAGNOSIS — G35 Multiple sclerosis: Secondary | ICD-10-CM

## 2023-04-08 ENCOUNTER — Encounter: Payer: Self-pay | Admitting: Neurology

## 2023-04-10 ENCOUNTER — Ambulatory Visit
Admission: RE | Admit: 2023-04-10 | Discharge: 2023-04-10 | Disposition: A | Discharge status: Home / Self Care | Payer: Medicaid Other | Source: Ambulatory Visit | Attending: Neurology | Admitting: Neurology

## 2023-04-10 DIAGNOSIS — G35 Multiple sclerosis: Secondary | ICD-10-CM

## 2023-04-10 MED ORDER — GADOPICLENOL 0.5 MMOL/ML IV SOLN
10.0000 mL | Freq: Once | INTRAVENOUS | Status: AC | PRN
  Administered 2023-04-10: 10 mL via INTRAVENOUS

## 2023-07-10 NOTE — Therapy (Signed)
[facility] [address] [place], [place], [address] Phone: [phone]   Fax:  [phone]  Pt arrived in error, no treatment  Lynnea Maizes PT, DPT, GCS  Berdina Cheever, PT 07/10/2023, 10:54 AM  [facility] [address] [place], [place], [address] Phone: [phone]   Fax:  [phone]  Name: KIMBRELY TORI MRN: [id number] Date of Birth: [birth year]

## 2023-09-03 ENCOUNTER — Ambulatory Visit: Payer: Medicaid Other | Attending: Family Medicine | Admitting: Physical Therapy

## 2023-09-03 ENCOUNTER — Encounter: Payer: Self-pay | Admitting: Physical Therapy

## 2023-09-03 DIAGNOSIS — M6281 Muscle weakness (generalized): Secondary | ICD-10-CM | POA: Insufficient documentation

## 2023-09-03 DIAGNOSIS — M542 Cervicalgia: Secondary | ICD-10-CM | POA: Insufficient documentation

## 2023-09-03 DIAGNOSIS — R293 Abnormal posture: Secondary | ICD-10-CM | POA: Diagnosis present
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
## 2023-09-10 ENCOUNTER — Encounter: Payer: Self-pay | Admitting: Physical Therapy

## 2023-09-10 ENCOUNTER — Ambulatory Visit: Payer: Medicaid Other | Admitting: Physical Therapy

## 2023-09-10 DIAGNOSIS — R293 Abnormal posture: Secondary | ICD-10-CM

## 2023-09-10 DIAGNOSIS — M542 Cervicalgia: Secondary | ICD-10-CM

## 2023-09-10 DIAGNOSIS — M6281 Muscle weakness (generalized): Secondary | ICD-10-CM

## 2023-09-16 ENCOUNTER — Encounter: Payer: Medicaid Other | Admitting: Physical Therapy

## 2023-09-18 ENCOUNTER — Ambulatory Visit: Payer: Medicaid Other | Admitting: Physical Therapy
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
## 2023-09-23 ENCOUNTER — Ambulatory Visit: Payer: Medicaid Other | Admitting: Physical Therapy

## 2023-09-23 ENCOUNTER — Encounter: Payer: Self-pay | Admitting: Physical Therapy

## 2023-09-23 DIAGNOSIS — M542 Cervicalgia: Secondary | ICD-10-CM | POA: Diagnosis not present

## 2023-09-23 DIAGNOSIS — M6281 Muscle weakness (generalized): Secondary | ICD-10-CM

## 2023-09-23 DIAGNOSIS — R293 Abnormal posture: Secondary | ICD-10-CM
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
## 2023-10-01 ENCOUNTER — Encounter: Payer: Self-pay | Admitting: Physical Therapy

## 2023-10-01 ENCOUNTER — Ambulatory Visit: Payer: Medicaid Other | Attending: Family Medicine

## 2023-10-01 DIAGNOSIS — M542 Cervicalgia: Secondary | ICD-10-CM | POA: Insufficient documentation

## 2023-10-01 DIAGNOSIS — R42 Dizziness and giddiness: Secondary | ICD-10-CM | POA: Diagnosis present

## 2023-10-01 DIAGNOSIS — M6281 Muscle weakness (generalized): Secondary | ICD-10-CM | POA: Diagnosis present

## 2023-10-01 DIAGNOSIS — R293 Abnormal posture: Secondary | ICD-10-CM | POA: Insufficient documentation

## 2023-10-03 ENCOUNTER — Ambulatory Visit: Payer: Medicaid Other

## 2023-10-03 DIAGNOSIS — M6281 Muscle weakness (generalized): Secondary | ICD-10-CM

## 2023-10-03 DIAGNOSIS — R42 Dizziness and giddiness: Secondary | ICD-10-CM

## 2023-10-03 DIAGNOSIS — R293 Abnormal posture: Secondary | ICD-10-CM

## 2023-10-03 DIAGNOSIS — M542 Cervicalgia: Secondary | ICD-10-CM | POA: Diagnosis not present
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
## 2023-10-08 ENCOUNTER — Ambulatory Visit: Payer: Medicaid Other | Admitting: Physical Therapy
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
## 2023-10-10 ENCOUNTER — Ambulatory Visit: Payer: Medicaid Other | Admitting: Physical Therapy

## 2023-10-10 DIAGNOSIS — M542 Cervicalgia: Secondary | ICD-10-CM

## 2023-10-10 DIAGNOSIS — R293 Abnormal posture: Secondary | ICD-10-CM

## 2023-10-10 DIAGNOSIS — M6281 Muscle weakness (generalized): Secondary | ICD-10-CM
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
## 2023-10-15 ENCOUNTER — Ambulatory Visit: Payer: Medicaid Other | Admitting: Physical Therapy

## 2023-10-15 ENCOUNTER — Encounter: Payer: Self-pay | Admitting: Physical Therapy

## 2023-10-15 DIAGNOSIS — R293 Abnormal posture: Secondary | ICD-10-CM

## 2023-10-15 DIAGNOSIS — M6281 Muscle weakness (generalized): Secondary | ICD-10-CM

## 2023-10-15 DIAGNOSIS — M542 Cervicalgia: Secondary | ICD-10-CM

## 2023-10-17 ENCOUNTER — Ambulatory Visit: Payer: Medicaid Other | Admitting: Physical Therapy

## 2023-10-22 ENCOUNTER — Encounter: Payer: Medicaid Other | Admitting: Physical Therapy

## 2023-10-24 ENCOUNTER — Encounter: Payer: Medicaid Other | Admitting: Physical Therapy

## 2023-10-29 ENCOUNTER — Other Ambulatory Visit: Payer: Self-pay | Admitting: Family Medicine

## 2023-10-29 DIAGNOSIS — Z1231 Encounter for screening mammogram for malignant neoplasm of breast: Secondary | ICD-10-CM

## 2024-09-15 ENCOUNTER — Ambulatory Visit: Admission: EM | Admit: 2024-09-15 | Discharge: 2024-09-15 | Disposition: A | Discharge status: Home / Self Care

## 2024-09-15 DIAGNOSIS — J0140 Acute pansinusitis, unspecified: Secondary | ICD-10-CM

## 2024-09-15 MED ORDER — AMOXICILLIN-POT CLAVULANATE 875-125 MG PO TABS: 1.0000 | ORAL_TABLET | Freq: Two times a day (BID) | ORAL | 0 refills | Status: AC

## 2024-09-15 MED ORDER — IPRATROPIUM BROMIDE 0.06 % NA SOLN: 2.0000 | Freq: Four times a day (QID) | NASAL | 12 refills | Status: AC

## 2024-09-15 NOTE — ED Triage Notes (Signed)
 Patient to Urgent Care with complaints of  sinus pressure and pain that radiates into her teeth. Frontal headache. Yellow nasal congestion.  Symptoms x5 days.  Meds: sudafed/ tylenol 

## 2024-09-15 NOTE — ED Provider Notes (Signed)
 " [facility]    CSN: [id number] Arrival date & time: 09/15/24  9043      History   Chief Complaint Chief Complaint  Patient presents with   Facial Pain   Nasal Congestion    HPI Meagan Powers is a 39 y.o. female.   HPI  39 year old female with past medical [facility] for migraine headaches, dysmenorrhea, prediabetes, and MS presents for evaluation of respiratory complaints that began a week ago. She denies fever, ear pain, or sore throat but she has frontal headache, sinus pressure in her cheeks going into her upper teeth, and thick yellow nasal discharge.  She also has an infrequent, nonproductive cough, mainly when she lays down.  Past Medical History:  Diagnosis Date   Abnormal uterine bleeding (AUB)    Acanthosis nigricans    AR (allergic rhinitis)    Cervical dysplasia    Dysmenorrhea    Dyspareunia in female    Incompetent cervix    Increased BMI    Migraine    Migraine    Multiple sclerosis    Pre-diabetes     Patient Active Problem List   Diagnosis Date Noted   Blood per rectum    Anal fissure    Abnormal uterine bleeding (AUB) 10/25/2015   Endometrial polyp 10/25/2015   Acanthosis nigricans 10/25/2015   Allergic rhinitis 10/25/2015   Moderate cervical dysplasia 10/25/2015   Prediabetes 10/25/2015   Dyspareunia, female 10/25/2015   Dysmenorrhea 10/25/2015   Increased BMI 10/25/2015    Past Surgical History:  Procedure Laterality Date   ABDOMINAL SURGERY     spleen/ pancrease   CERVICAL BIOPSY  W/ LOOP ELECTRODE EXCISION     CERVICAL CERCLAGE     x 2   CHOLECYSTECTOMY     COLONOSCOPY WITH PROPOFOL  N/A 10/15/2018   Procedure: COLONOSCOPY WITH PROPOFOL ;  Surgeon: Janalyn Keene NOVAK, MD;  Location: [facility] ENDOSCOPY;  Service: Endoscopy;  Laterality: N/A;   DILATION AND CURETTAGE OF UTERUS     HYSTEROSCOPY WITH D & C     LAPAROSCOPY      OB History     Gravida  4   Para  4   Term  1   Preterm  3   AB      Living  2       SAB      IAB      Ectopic      Multiple      Live Births  2            Home Medications    Prior to Admission medications  Medication Sig Start Date End Date Taking? Authorizing Provider  amoxicillin -clavulanate (AUGMENTIN ) 875-125 MG tablet Take 1 tablet by mouth every 12 (twelve) hours for 7 days. 09/15/24 09/22/24 Yes Bernardino Ditch, NP  aspirin 81 MG chewable tablet Chew 81 mg by mouth. 03/26/24 03/26/25 Yes [provider]  atorvastatin (LIPITOR) 80 MG tablet Take 80 mg by mouth. 09/09/24 09/09/25 Yes [provider]  gabapentin (NEURONTIN) 300 MG capsule  09/30/20  Yes [provider]  insulin glargine (LANTUS) 100 UNIT/ML injection Inject 5 Units into the skin daily.   Yes [provider]  ipratropium (ATROVENT ) 0.06 % nasal spray Place 2 sprays into both nostrils 4 (four) times daily. 09/15/24  Yes Bernardino Ditch, NP  metoprolol succinate (TOPROL-XL) 50 MG 24 hr tablet Take 50 mg by mouth daily. 09/09/24 09/09/25 Yes [provider]  nortriptyline (PAMELOR) 10 MG capsule Take 40  mg by mouth. 12/24/22  Yes [provider]  OCREVUS 300 MG/10ML injection Inject 600 mg into the vein. 08/23/22  Yes [provider]  OZEMPIC, 0.25 OR 0.5 MG/DOSE, 2 MG/3ML SOPN Inject 0.25 mg into the skin. 03/17/24  Yes [provider]  ticagrelor (BRILINTA) 90 MG TABS tablet Take 90 mg by mouth. 07/21/24  Yes [provider]  EPINEPHrine 0.3 mg/0.3 mL IJ SOAJ injection Inject 0.3 mg into the muscle. 03/12/16   [provider]  etonogestrel -ethinyl estradiol  (NUVARING) 0.12-0.015 MG/24HR vaginal ring INSERT NUVARING INTRAVAGINAL EVERY 21 DAYS X 4RINGS,THEN REMOVE LAST RING FOR 7 DAYS BEFORE RESTARTING CYCLE Patient not taking: Reported on 09/15/2024 10/04/21   Connell Davies, MD  fluticasone (FLONASE) 50 MCG/ACT nasal spray Place 2 sprays into both nostrils daily.    [provider]  ibuprofen (ADVIL,MOTRIN) 800 MG  tablet Take 800 mg by mouth every 8 (eight) hours as needed. Patient not taking: Reported on 07/27/2020    [provider]  SUMAtriptan (IMITREX) 50 MG tablet Take 50 mg by mouth every 2 (two) hours as needed for migraine. May repeat in 2 hours if headache persists or recurs.    [provider]    Family History Family History  Problem Relation Age of Onset   Hypertension Mother    Cancer Neg Hx    Diabetes Neg Hx    Heart disease Neg Hx     Social History Social History[1]   Allergies   Shellfish allergy and Iodine   Review of Systems Review of Systems  Constitutional:  Negative for fever.  HENT:  Positive for congestion, postnasal drip, rhinorrhea, sinus pressure and sinus pain. Negative for ear pain and sore throat.   Respiratory:  Positive for cough. Negative for shortness of breath and wheezing.      Physical Exam Triage Vital Signs ED Triage Vitals  Encounter Vitals Group     BP 09/15/24 1035 110/77     Girls Systolic BP Percentile --      Girls Diastolic BP Percentile --      Boys Systolic BP Percentile --      Boys Diastolic BP Percentile --      Pulse Rate 09/15/24 1035 (!) 104     Resp 09/15/24 1035 19     Temp 09/15/24 1035 97.8 F (36.6 C)     Temp src --      SpO2 09/15/24 1035 96 %     Weight --      Height --      Head Circumference --      Peak Flow --      Pain Score 09/15/24 1030 8     Pain Loc --      Pain Education --      Exclude from Growth Chart --    No data found.  Updated Vital Signs BP 110/77   Pulse (!) 104   Temp 97.8 F (36.6 C)   Resp 19   SpO2 96%   Visual Acuity Right Eye Distance:   Left Eye Distance:   Bilateral Distance:    Right Eye Near:   Left Eye Near:    Bilateral Near:     Physical Exam Vitals and nursing note reviewed.  Constitutional:      Appearance: Normal appearance. She is not ill-appearing.  HENT:     Head: Normocephalic and atraumatic.     Right Ear: Tympanic membrane, ear  canal and external ear normal. There is  no impacted cerumen.     Left Ear: Tympanic membrane, ear canal and external ear normal. There is no impacted cerumen.     Nose: Congestion and rhinorrhea present.     Comments: Nasal mucosa is edematous and erythematous with thick yellow discharge in both nares.  Bilateral frontal and maxillary sinuses are tender to compression.    Mouth/Throat:     Mouth: Mucous membranes are moist.     Pharynx: Oropharynx is clear. No oropharyngeal exudate or posterior oropharyngeal erythema.  Cardiovascular:     Rate and Rhythm: Normal rate and regular rhythm.     Pulses: Normal pulses.     Heart sounds: Normal heart sounds. No murmur heard.    No friction rub. No gallop.  Pulmonary:     Effort: Pulmonary effort is normal.     Breath sounds: Normal breath sounds. No wheezing, rhonchi or rales.  Musculoskeletal:     Cervical back: Normal range of motion and neck supple. No tenderness.  Lymphadenopathy:     Cervical: No cervical adenopathy.  Skin:    General: Skin is warm and dry.     Capillary Refill: Capillary refill takes less than 2 seconds.     Findings: No erythema or rash.  Neurological:     General: No focal deficit present.     Mental Status: She is alert and oriented to person, place, and time.      UC Treatments / Results  Labs (all labs ordered are listed, but only abnormal results are displayed) Labs Reviewed - No data to display  EKG   Radiology No results found.  Procedures Procedures (including critical care time)  Medications Ordered in UC Medications - No data to display  Initial Impression / Assessment and Plan / UC Course  I have reviewed the triage vital signs and the nursing notes.  Pertinent labs & imaging results that were available during my care of the patient were reviewed by me and considered in my medical decision making (see chart for details).   Patient is a pleasant, nontoxic-appearing 39 year old female  presenting for evaluation of sinus complaints as outlined in HPI above.  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  She is mainly complaining of sinus pain and pressure in her frontal and maxillary sinuses.  She does have an infrequent cough when she lays down.  Her sinuses are tender to compression and she does have edema and erythema of her nasal mucosa with thick yellow discharge in both nares.  I will discharge her home with a diagnosis of pansinusitis on Augmentin  875 twice daily with food for a week.  I will provide her nasal spray to help with the congestion and drainage.  We discussed sinus irrigation as well.  Return precautions reviewed.  Work note provided.   Final Clinical Impressions(s) / UC Diagnoses   Final diagnoses:  Acute non-recurrent pansinusitis     Discharge Instructions      The Augmentin  twice daily with food for 7 days for treatment of your sinusitis.  Use the Atrovent  nasal spray, 2 squirts up each nostril be 6 hours, to help with nasal congestion and postnasal drip.  Perform sinus irrigation 2-3 times a day with a [employer] sinus rinse kit and distilled water.  Do not use tap water.  You can use plain over-the-counter Mucinex every 6 hours to break up the stickiness of the mucus so your body can clear it.  Increase  your oral fluid intake to thin out your mucus so that is also able for your body to clear more easily.  Take an over-the-counter probiotic, such as Culturelle-align-activia, 1 hour after each dose of antibiotic to prevent diarrhea.  If you develop any new or worsening symptoms return for reevaluation or see your primary care provider.      ED Prescriptions     Medication Sig Dispense Auth. Provider   amoxicillin -clavulanate (AUGMENTIN ) 875-125 MG tablet Take 1 tablet by mouth every 12 (twelve) hours for 7 days. 14 tablet Bernardino Ditch, NP   ipratropium (ATROVENT ) 0.06 % nasal spray Place  2 sprays into both nostrils 4 (four) times daily. 15 mL Bernardino Ditch, NP      PDMP not reviewed this encounter.    [1]  Social History Tobacco Use   Smoking status: Never   Smokeless tobacco: Never  Vaping Use   Vaping status: Never Used  Substance Use Topics   Alcohol use: Yes    Comment: occas   Drug use: No     Bernardino Ditch, NP 09/15/24 1050  "

## 2024-09-15 NOTE — Discharge Instructions (Addendum)
 The Augmentin  twice daily with food for 7 days for treatment of your sinusitis.  Use the Atrovent  nasal spray, 2 squirts up each nostril be 6 hours, to help with nasal congestion and postnasal drip.  Perform sinus irrigation 2-3 times a day with a [employer] sinus rinse kit and distilled water.  Do not use tap water.  You can use plain over-the-counter Mucinex every 6 hours to break up the stickiness of the mucus so your body can clear it.  Increase your oral fluid intake to thin out your mucus so that is also able for your body to clear more easily.  Take an over-the-counter probiotic, such as Culturelle-align-activia, 1 hour after each dose of antibiotic to prevent diarrhea.  If you develop any new or worsening symptoms return for reevaluation or see your primary care provider.
